# Patient Record
Sex: Male | Born: 1950 | Race: White | Hispanic: No | Marital: Married | Smoking: Never smoker
Health system: Southern US, Community
[De-identification: ages and names within clinical notes are randomized; demographics above are authoritative.]

## PROBLEM LIST (undated history)

## (undated) ENCOUNTER — Emergency Department (HOSPITAL_COMMUNITY): Admission: EM | Payer: Self-pay | Source: Home / Self Care

## (undated) DIAGNOSIS — J45909 Unspecified asthma, uncomplicated: Secondary | ICD-10-CM

## (undated) DIAGNOSIS — T8859XA Other complications of anesthesia, initial encounter: Secondary | ICD-10-CM

## (undated) DIAGNOSIS — L509 Urticaria, unspecified: Secondary | ICD-10-CM

## (undated) DIAGNOSIS — Z9889 Other specified postprocedural states: Secondary | ICD-10-CM

## (undated) DIAGNOSIS — T783XXA Angioneurotic edema, initial encounter: Secondary | ICD-10-CM

## (undated) DIAGNOSIS — C801 Malignant (primary) neoplasm, unspecified: Secondary | ICD-10-CM

## (undated) HISTORY — PX: ESOPHAGEAL DILATION: SHX303

## (undated) HISTORY — PX: CATARACT EXTRACTION, BILATERAL: SHX1313

## (undated) HISTORY — PX: COLONOSCOPY: SHX174

## (undated) HISTORY — DX: Urticaria, unspecified: L50.9

## (undated) HISTORY — DX: Angioneurotic edema, initial encounter: T78.3XXA

---
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
## 1998-07-06 ENCOUNTER — Encounter: Payer: Self-pay | Admitting: Emergency Medicine

## 1998-07-06 ENCOUNTER — Emergency Department (HOSPITAL_COMMUNITY): Admission: EM | Admit: 1998-07-06 | Discharge: 1998-07-06 | Payer: Self-pay

## 2003-12-20 ENCOUNTER — Ambulatory Visit (HOSPITAL_COMMUNITY): Admission: RE | Admit: 2003-12-20 | Discharge: 2003-12-20 | Payer: Self-pay | Admitting: Gastroenterology

## 2013-11-11 NOTE — ED Notes (Signed)
The pt no lolnger wants to be seen when called to triaged.  He took a benadryl and his symptoms have subsided.  He will return if he needs to.  He has had similar  Episodes in the past

## 2015-03-04 DIAGNOSIS — J45909 Unspecified asthma, uncomplicated: Secondary | ICD-10-CM | POA: Diagnosis not present

## 2015-09-03 DIAGNOSIS — H2513 Age-related nuclear cataract, bilateral: Secondary | ICD-10-CM | POA: Diagnosis not present

## 2015-09-03 DIAGNOSIS — D3132 Benign neoplasm of left choroid: Secondary | ICD-10-CM | POA: Diagnosis not present

## 2015-09-03 DIAGNOSIS — H02831 Dermatochalasis of right upper eyelid: Secondary | ICD-10-CM | POA: Diagnosis not present

## 2015-09-03 DIAGNOSIS — H02834 Dermatochalasis of left upper eyelid: Secondary | ICD-10-CM | POA: Diagnosis not present

## 2015-09-22 DIAGNOSIS — H2511 Age-related nuclear cataract, right eye: Secondary | ICD-10-CM | POA: Diagnosis not present

## 2015-09-22 DIAGNOSIS — H25811 Combined forms of age-related cataract, right eye: Secondary | ICD-10-CM | POA: Diagnosis not present

## 2015-11-03 DIAGNOSIS — H2512 Age-related nuclear cataract, left eye: Secondary | ICD-10-CM | POA: Diagnosis not present

## 2015-11-04 DIAGNOSIS — H2512 Age-related nuclear cataract, left eye: Secondary | ICD-10-CM | POA: Diagnosis not present

## 2015-11-07 DIAGNOSIS — D0462 Carcinoma in situ of skin of left upper limb, including shoulder: Secondary | ICD-10-CM | POA: Diagnosis not present

## 2015-11-07 DIAGNOSIS — L814 Other melanin hyperpigmentation: Secondary | ICD-10-CM | POA: Diagnosis not present

## 2015-11-07 DIAGNOSIS — C44629 Squamous cell carcinoma of skin of left upper limb, including shoulder: Secondary | ICD-10-CM | POA: Diagnosis not present

## 2015-11-07 DIAGNOSIS — Z85828 Personal history of other malignant neoplasm of skin: Secondary | ICD-10-CM | POA: Diagnosis not present

## 2015-11-07 DIAGNOSIS — L821 Other seborrheic keratosis: Secondary | ICD-10-CM | POA: Diagnosis not present

## 2015-11-07 DIAGNOSIS — D225 Melanocytic nevi of trunk: Secondary | ICD-10-CM | POA: Diagnosis not present

## 2015-11-07 DIAGNOSIS — L570 Actinic keratosis: Secondary | ICD-10-CM | POA: Diagnosis not present

## 2015-12-08 DIAGNOSIS — Z0000 Encounter for general adult medical examination without abnormal findings: Secondary | ICD-10-CM | POA: Diagnosis not present

## 2015-12-08 DIAGNOSIS — Z125 Encounter for screening for malignant neoplasm of prostate: Secondary | ICD-10-CM | POA: Diagnosis not present

## 2015-12-08 DIAGNOSIS — Z23 Encounter for immunization: Secondary | ICD-10-CM | POA: Diagnosis not present

## 2015-12-08 DIAGNOSIS — E7800 Pure hypercholesterolemia, unspecified: Secondary | ICD-10-CM | POA: Diagnosis not present

## 2016-05-06 DIAGNOSIS — Z85828 Personal history of other malignant neoplasm of skin: Secondary | ICD-10-CM | POA: Diagnosis not present

## 2016-05-06 DIAGNOSIS — L570 Actinic keratosis: Secondary | ICD-10-CM | POA: Diagnosis not present

## 2016-05-06 DIAGNOSIS — L814 Other melanin hyperpigmentation: Secondary | ICD-10-CM | POA: Diagnosis not present

## 2016-05-06 DIAGNOSIS — D1801 Hemangioma of skin and subcutaneous tissue: Secondary | ICD-10-CM | POA: Diagnosis not present

## 2016-05-06 DIAGNOSIS — L821 Other seborrheic keratosis: Secondary | ICD-10-CM | POA: Diagnosis not present

## 2016-05-06 DIAGNOSIS — D225 Melanocytic nevi of trunk: Secondary | ICD-10-CM | POA: Diagnosis not present

## 2016-11-08 DIAGNOSIS — L821 Other seborrheic keratosis: Secondary | ICD-10-CM | POA: Diagnosis not present

## 2016-11-08 DIAGNOSIS — L570 Actinic keratosis: Secondary | ICD-10-CM | POA: Diagnosis not present

## 2016-11-08 DIAGNOSIS — Z85828 Personal history of other malignant neoplasm of skin: Secondary | ICD-10-CM | POA: Diagnosis not present

## 2016-11-08 DIAGNOSIS — D1801 Hemangioma of skin and subcutaneous tissue: Secondary | ICD-10-CM | POA: Diagnosis not present

## 2016-11-08 DIAGNOSIS — L814 Other melanin hyperpigmentation: Secondary | ICD-10-CM | POA: Diagnosis not present

## 2016-12-10 DIAGNOSIS — Z0000 Encounter for general adult medical examination without abnormal findings: Secondary | ICD-10-CM | POA: Diagnosis not present

## 2016-12-10 DIAGNOSIS — Z23 Encounter for immunization: Secondary | ICD-10-CM | POA: Diagnosis not present

## 2016-12-10 DIAGNOSIS — Z125 Encounter for screening for malignant neoplasm of prostate: Secondary | ICD-10-CM | POA: Diagnosis not present

## 2016-12-10 DIAGNOSIS — E7800 Pure hypercholesterolemia, unspecified: Secondary | ICD-10-CM | POA: Diagnosis not present

## 2017-04-08 DIAGNOSIS — B078 Other viral warts: Secondary | ICD-10-CM | POA: Diagnosis not present

## 2017-04-08 DIAGNOSIS — L570 Actinic keratosis: Secondary | ICD-10-CM | POA: Diagnosis not present

## 2017-04-08 DIAGNOSIS — L821 Other seborrheic keratosis: Secondary | ICD-10-CM | POA: Diagnosis not present

## 2017-04-08 DIAGNOSIS — D225 Melanocytic nevi of trunk: Secondary | ICD-10-CM | POA: Diagnosis not present

## 2017-04-08 DIAGNOSIS — L820 Inflamed seborrheic keratosis: Secondary | ICD-10-CM | POA: Diagnosis not present

## 2017-04-08 DIAGNOSIS — Z85828 Personal history of other malignant neoplasm of skin: Secondary | ICD-10-CM | POA: Diagnosis not present

## 2017-04-08 DIAGNOSIS — L814 Other melanin hyperpigmentation: Secondary | ICD-10-CM | POA: Diagnosis not present

## 2017-08-30 DIAGNOSIS — H903 Sensorineural hearing loss, bilateral: Secondary | ICD-10-CM | POA: Diagnosis not present

## 2017-08-31 ENCOUNTER — Ambulatory Visit (INDEPENDENT_AMBULATORY_CARE_PROVIDER_SITE_OTHER): Payer: PPO | Admitting: Allergy & Immunology

## 2017-08-31 ENCOUNTER — Encounter: Payer: Self-pay | Admitting: Allergy & Immunology

## 2017-08-31 VITALS — BP 148/86 | HR 83 | Temp 98.0°F | Resp 19 | Ht 69.0 in | Wt 165.4 lb

## 2017-08-31 DIAGNOSIS — T7800XA Anaphylactic reaction due to unspecified food, initial encounter: Secondary | ICD-10-CM | POA: Insufficient documentation

## 2017-08-31 DIAGNOSIS — T63481A Toxic effect of venom of other arthropod, accidental (unintentional), initial encounter: Secondary | ICD-10-CM

## 2017-08-31 DIAGNOSIS — T7800XD Anaphylactic reaction due to unspecified food, subsequent encounter: Secondary | ICD-10-CM

## 2017-08-31 MED ORDER — EPINEPHRINE 0.3 MG/0.3ML IJ SOAJ: 0.3000 mg | Freq: Once | INTRAMUSCULAR | 2 refills | Status: AC

## 2017-08-31 NOTE — Progress Notes (Signed)
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 Anaphylactic shock - with a history consistent with alpha gal - We will get an alpha gal panel to evaluate for red meat sensitivity. - We will call you in 1-2 weeks with the results. - We will send in a prescription for EpiPen. - Information on alpha gal provided.  - I will talk to Dr. Stephan Minister about research studies.  - Natural course of alpha gal allergy discussed.   2. 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 - We will call you in 1-2 weeks with the results. - Consider venom immunotherapy in the future.  3. Return in about 2 years (around 09/01/2019).  Subjective:   Raymond Hess is a 67 y.o. male presenting today for evaluation of  Chief Complaint  Patient presents with  . Allergic Reaction    Alpha Gal   . Urticaria    Raymond Hess has a history of the following: Patient Active Problem List   Diagnosis Date Noted  . 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  Raymond Hess was referred by Alroy Dust, L.Marlou Sa, MD.     Raymond Hess is a 67 y.o. male presenting for an evaluation of alpha gal. He reports that he has had problems with red meat for years. He did eat a pepperoni pizza two months ago and broke out in hives. He does a lot of work with the outdoors [facility] to [employer]). He gets tick bites relatively frequently. Many years ago - around 7 years ago - he started having problems. He would awaken with hives over his entire body. Benadryl seemed to help. Finally he made the connection between the red meat ingestion and the symptoms. He does have one episode of stomach pain. Benadryl improves it  fairly quickly.   He is also concerned with a reaction to insect bites. Around 13 years ago, he was bit by fire ants and had a back several years ago. Then he started breaking out in mosquito bites like lesions. He did not have breathing problems, but he was turning red over his entire body. He has no problems with stinging insect anaphylaxis. He does spend a lot of time outdoors with his extracurricular activities.     Otherwise, there is no history of other atopic diseases, including asthma, drug allergies, food allergies, environmental allergies, or urticaria. There is no significant infectious history. Vaccinations are up to date.    Past Medical History: Patient Active Problem List   Diagnosis Date Noted  . 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 Always use your most recent med list.          EPINEPHrine 0.3 mg/0.3 mL Soaj injection Commonly known as:  EPIPEN 2-PAK Inject 0.3 mLs (0.3 mg total) into the muscle once for 1 dose.       Birth History: non-contributory.   Developmental History: non-contributory.  Past Surgical History: Past Surgical History:  Procedure Laterality Date  . CATARACT EXTRACTION, BILATERAL Bilateral      Family History: History reviewed. No pertinent family history.   Social History: Raymond Hess lives at home with his wife.  They live in a house that is 54 years old.  There is wood in the main living areas and carpeting in the bedrooms.  They have electric heating and central cooling.  There are 2 dogs in the home.  There are no dust mite covers on the bedding.  There is no tobacco exposure.  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      Review of Systems: a 14-point review of systems is pertinent for what is  mentioned in HPI.  Otherwise, all other systems were negative. Constitutional: negative other than that listed in the HPI Eyes: negative other than that listed in the HPI Ears, nose, mouth, throat, and face: negative other than that listed in the HPI Respiratory: negative other than that listed in the HPI Cardiovascular: negative other than that listed in the HPI Gastrointestinal: negative other than that listed in the HPI Genitourinary: negative other than that listed in the HPI Integument: negative other than that listed in the HPI Hematologic: negative other than that listed in the HPI Musculoskeletal: negative other than that listed in the HPI Neurological: negative other than that listed in the HPI Allergy/Immunologic: negative other than that listed in the HPI    Objective:   Blood pressure (!) 148/86, pulse 83, temperature 98 F (36.7 C), temperature source Oral, resp. rate 19, height 5\' 9"  (1.753 m), weight 165 lb 6.4 oz (75 kg), SpO2 99 %. Body mass index is 24.43 kg/m.   Physical Exam:  General: Alert, interactive, in no acute distress. Pleasant male.  Eyes: No conjunctival injection bilaterally, no discharge on the right, no discharge on the left and no Horner-Trantas dots present. PERRL bilaterally. EOMI without pain. No photophobia.  Ears: Right TM pearly gray with normal light reflex, Left TM pearly gray with normal light reflex, Right TM intact without perforation and Left TM intact without perforation.  Nose/Throat: External nose within normal limits and septum midline. Turbinates minimally edematous without discharge. Posterior oropharynx mildly erythematous without cobblestoning in the posterior oropharynx. Tonsils 2+ without exudates.  Tongue without thrush. Neck: Supple without thyromegaly. Trachea midline. Adenopathy: no enlarged lymph nodes appreciated in the anterior cervical, occipital, axillary, epitrochlear, inguinal, or popliteal regions. Lungs: Clear to  auscultation without wheezing, rhonchi or rales. No increased work of breathing. CV: Normal S1/S2. No murmurs. Capillary refill <2 seconds.  Abdomen: Nondistended, nontender. No guarding or rebound tenderness. Bowel sounds present in all fields and hypoactive  Skin: Warm and dry, without lesions or rashes. Extremities:  No clubbing, cyanosis or edema. Neuro:   Grossly intact. No focal deficits appreciated. Responsive to questions.  Diagnostic studies: none      Salvatore Marvel, MD [facility]

## 2017-08-31 NOTE — Patient Instructions (Addendum)
1. Anaphylactic shock due to food - We will get an alpha gal panel to evaluate for red meat sensitivity. - We will call you in 1-2 weeks with the results. - We will send in a prescription for EpiPen. - Information on alpha gal provided.  - I will talk to Dr. Stephan Minister about research studies.   2. 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 - We will call you in 1-2 weeks with the results. - Consider venom immunotherapy in the future.  3. Return in about 2 years (around 09/01/2019).   Please inform us of any Emergency Department visits, hospitalizations, or changes in symptoms. Call us before going to the ED for breathing or allergy symptoms since we might be able to fit you in for a sick visit. Feel free to contact us anytime with any questions, problems, or concerns.  It was a pleasure to meet you and your family today! Good luck with the [place] [facility] [place]!   Websites that have reliable patient information: 1. American Academy of Asthma, Allergy, and Immunology: www.aaaai.org 2. Food Allergy Research and Education (FARE): foodallergy.org 3. Mothers of Asthmatics: http://www.asthmacommunitynetwork.org 4. American College of Allergy, Asthma, and Immunology: MonthlyElectricBill.co.uk   Make sure you are registered to vote! If you have moved or changed any of your contact information, you will need to get this updated before voting!

## 2017-09-06 LAB — ALPHA-GAL PANEL
Class Interpretation: 4
Class Interpretation: 6
Class Interpretation: 6
Lamb/Mutton (Ovis spp) IgE: 33.7 kU/L — ABNORMAL HIGH (ref <0.35)

## 2017-09-06 LAB — ALLERGEN FIRE ANT: I070-IgE Fire Ant (Invicta): 0.14 kU/L — AB

## 2017-09-06 LAB — TRYPTASE: Tryptase: 5.5 ug/L (ref 2.2–13.2)

## 2017-09-07 DIAGNOSIS — H903 Sensorineural hearing loss, bilateral: Secondary | ICD-10-CM | POA: Diagnosis not present

## 2017-11-30 DIAGNOSIS — L430 Hypertrophic lichen planus: Secondary | ICD-10-CM | POA: Diagnosis not present

## 2017-11-30 DIAGNOSIS — L821 Other seborrheic keratosis: Secondary | ICD-10-CM | POA: Diagnosis not present

## 2017-11-30 DIAGNOSIS — L438 Other lichen planus: Secondary | ICD-10-CM | POA: Diagnosis not present

## 2017-11-30 DIAGNOSIS — L570 Actinic keratosis: Secondary | ICD-10-CM | POA: Diagnosis not present

## 2017-11-30 DIAGNOSIS — D0462 Carcinoma in situ of skin of left upper limb, including shoulder: Secondary | ICD-10-CM | POA: Diagnosis not present

## 2017-11-30 DIAGNOSIS — D485 Neoplasm of uncertain behavior of skin: Secondary | ICD-10-CM | POA: Diagnosis not present

## 2017-11-30 DIAGNOSIS — Z85828 Personal history of other malignant neoplasm of skin: Secondary | ICD-10-CM | POA: Diagnosis not present

## 2018-01-10 DIAGNOSIS — E7800 Pure hypercholesterolemia, unspecified: Secondary | ICD-10-CM | POA: Diagnosis not present

## 2018-01-10 DIAGNOSIS — Z0000 Encounter for general adult medical examination without abnormal findings: Secondary | ICD-10-CM | POA: Diagnosis not present

## 2018-01-10 DIAGNOSIS — Z125 Encounter for screening for malignant neoplasm of prostate: Secondary | ICD-10-CM | POA: Diagnosis not present

## 2018-12-04 DIAGNOSIS — L821 Other seborrheic keratosis: Secondary | ICD-10-CM | POA: Diagnosis not present

## 2018-12-04 DIAGNOSIS — D1801 Hemangioma of skin and subcutaneous tissue: Secondary | ICD-10-CM | POA: Diagnosis not present

## 2018-12-04 DIAGNOSIS — C44612 Basal cell carcinoma of skin of right upper limb, including shoulder: Secondary | ICD-10-CM | POA: Diagnosis not present

## 2018-12-04 DIAGNOSIS — L720 Epidermal cyst: Secondary | ICD-10-CM | POA: Diagnosis not present

## 2018-12-04 DIAGNOSIS — Z85828 Personal history of other malignant neoplasm of skin: Secondary | ICD-10-CM | POA: Diagnosis not present

## 2018-12-04 DIAGNOSIS — D225 Melanocytic nevi of trunk: Secondary | ICD-10-CM | POA: Diagnosis not present

## 2018-12-04 DIAGNOSIS — L570 Actinic keratosis: Secondary | ICD-10-CM | POA: Diagnosis not present

## 2018-12-04 DIAGNOSIS — L814 Other melanin hyperpigmentation: Secondary | ICD-10-CM | POA: Diagnosis not present

## 2019-02-26 DIAGNOSIS — Z0000 Encounter for general adult medical examination without abnormal findings: Secondary | ICD-10-CM | POA: Diagnosis not present

## 2019-02-26 DIAGNOSIS — Z125 Encounter for screening for malignant neoplasm of prostate: Secondary | ICD-10-CM | POA: Diagnosis not present

## 2019-02-26 DIAGNOSIS — E7800 Pure hypercholesterolemia, unspecified: Secondary | ICD-10-CM | POA: Diagnosis not present

## 2019-02-27 DIAGNOSIS — E7800 Pure hypercholesterolemia, unspecified: Secondary | ICD-10-CM | POA: Diagnosis not present

## 2019-02-27 DIAGNOSIS — Z125 Encounter for screening for malignant neoplasm of prostate: Secondary | ICD-10-CM | POA: Diagnosis not present

## 2019-05-30 DIAGNOSIS — D1801 Hemangioma of skin and subcutaneous tissue: Secondary | ICD-10-CM | POA: Diagnosis not present

## 2019-05-30 DIAGNOSIS — D485 Neoplasm of uncertain behavior of skin: Secondary | ICD-10-CM | POA: Diagnosis not present

## 2019-05-30 DIAGNOSIS — Z85828 Personal history of other malignant neoplasm of skin: Secondary | ICD-10-CM | POA: Diagnosis not present

## 2019-05-30 DIAGNOSIS — L814 Other melanin hyperpigmentation: Secondary | ICD-10-CM | POA: Diagnosis not present

## 2019-05-30 DIAGNOSIS — L821 Other seborrheic keratosis: Secondary | ICD-10-CM | POA: Diagnosis not present

## 2019-05-30 DIAGNOSIS — L820 Inflamed seborrheic keratosis: Secondary | ICD-10-CM | POA: Diagnosis not present

## 2019-05-30 DIAGNOSIS — L570 Actinic keratosis: Secondary | ICD-10-CM | POA: Diagnosis not present

## 2019-05-30 DIAGNOSIS — D225 Melanocytic nevi of trunk: Secondary | ICD-10-CM | POA: Diagnosis not present

## 2019-06-26 ENCOUNTER — Other Ambulatory Visit: Payer: Self-pay | Admitting: Physician Assistant

## 2019-06-26 DIAGNOSIS — R1012 Left upper quadrant pain: Secondary | ICD-10-CM

## 2019-06-26 DIAGNOSIS — R1011 Right upper quadrant pain: Secondary | ICD-10-CM

## 2019-06-26 DIAGNOSIS — F1099 Alcohol use, unspecified with unspecified alcohol-induced disorder: Secondary | ICD-10-CM | POA: Diagnosis not present

## 2019-06-26 DIAGNOSIS — R1310 Dysphagia, unspecified: Secondary | ICD-10-CM | POA: Diagnosis not present

## 2019-06-27 ENCOUNTER — Ambulatory Visit
Admission: RE | Admit: 2019-06-27 | Discharge: 2019-06-27 | Disposition: A | Discharge status: Home / Self Care | Payer: PPO | Source: Ambulatory Visit | Attending: Physician Assistant | Admitting: Physician Assistant

## 2019-06-27 DIAGNOSIS — R1011 Right upper quadrant pain: Secondary | ICD-10-CM

## 2019-06-27 DIAGNOSIS — R1013 Epigastric pain: Secondary | ICD-10-CM | POA: Diagnosis not present

## 2019-06-27 DIAGNOSIS — R1012 Left upper quadrant pain: Secondary | ICD-10-CM

## 2019-07-02 ENCOUNTER — Encounter: Payer: Self-pay | Admitting: Physician Assistant

## 2019-07-11 DIAGNOSIS — R109 Unspecified abdominal pain: Secondary | ICD-10-CM | POA: Diagnosis not present

## 2019-07-11 DIAGNOSIS — Z86010 Personal history of colonic polyps: Secondary | ICD-10-CM | POA: Diagnosis not present

## 2019-07-11 DIAGNOSIS — R1310 Dysphagia, unspecified: Secondary | ICD-10-CM | POA: Diagnosis not present

## 2019-07-24 ENCOUNTER — Ambulatory Visit: Payer: PPO | Admitting: Physician Assistant

## 2019-08-16 DIAGNOSIS — Z1159 Encounter for screening for other viral diseases: Secondary | ICD-10-CM | POA: Diagnosis not present

## 2019-08-21 ENCOUNTER — Other Ambulatory Visit (HOSPITAL_COMMUNITY): Payer: Self-pay | Admitting: Gastroenterology

## 2019-08-21 ENCOUNTER — Other Ambulatory Visit: Payer: Self-pay | Admitting: Gastroenterology

## 2019-08-21 DIAGNOSIS — K449 Diaphragmatic hernia without obstruction or gangrene: Secondary | ICD-10-CM | POA: Diagnosis not present

## 2019-08-21 DIAGNOSIS — K2289 Other specified disease of esophagus: Secondary | ICD-10-CM

## 2019-08-21 DIAGNOSIS — R1310 Dysphagia, unspecified: Secondary | ICD-10-CM | POA: Diagnosis not present

## 2019-08-21 DIAGNOSIS — C159 Malignant neoplasm of esophagus, unspecified: Secondary | ICD-10-CM | POA: Diagnosis not present

## 2019-08-21 DIAGNOSIS — K648 Other hemorrhoids: Secondary | ICD-10-CM | POA: Diagnosis not present

## 2019-08-21 DIAGNOSIS — D123 Benign neoplasm of transverse colon: Secondary | ICD-10-CM | POA: Diagnosis not present

## 2019-08-21 DIAGNOSIS — D490 Neoplasm of unspecified behavior of digestive system: Secondary | ICD-10-CM | POA: Diagnosis not present

## 2019-08-21 DIAGNOSIS — Z86010 Personal history of colonic polyps: Secondary | ICD-10-CM | POA: Diagnosis not present

## 2019-08-21 DIAGNOSIS — K222 Esophageal obstruction: Secondary | ICD-10-CM | POA: Diagnosis not present

## 2019-08-23 DIAGNOSIS — K229 Disease of esophagus, unspecified: Secondary | ICD-10-CM | POA: Diagnosis not present

## 2019-08-24 DIAGNOSIS — D123 Benign neoplasm of transverse colon: Secondary | ICD-10-CM | POA: Diagnosis not present

## 2019-08-24 DIAGNOSIS — D490 Neoplasm of unspecified behavior of digestive system: Secondary | ICD-10-CM | POA: Diagnosis not present

## 2019-08-27 ENCOUNTER — Ambulatory Visit (HOSPITAL_COMMUNITY)
Admission: RE | Admit: 2019-08-27 | Discharge: 2019-08-27 | Disposition: A | Discharge status: Home / Self Care | Payer: PPO | Source: Ambulatory Visit | Attending: Gastroenterology | Admitting: Gastroenterology

## 2019-08-27 ENCOUNTER — Other Ambulatory Visit: Payer: Self-pay

## 2019-08-27 DIAGNOSIS — R1013 Epigastric pain: Secondary | ICD-10-CM | POA: Insufficient documentation

## 2019-08-27 DIAGNOSIS — K2289 Other specified disease of esophagus: Secondary | ICD-10-CM

## 2019-08-27 DIAGNOSIS — R918 Other nonspecific abnormal finding of lung field: Secondary | ICD-10-CM | POA: Diagnosis not present

## 2019-08-27 DIAGNOSIS — K228 Other specified diseases of esophagus: Secondary | ICD-10-CM | POA: Insufficient documentation

## 2019-08-27 DIAGNOSIS — C159 Malignant neoplasm of esophagus, unspecified: Secondary | ICD-10-CM | POA: Insufficient documentation

## 2019-08-27 MED ORDER — IOHEXOL 300 MG/ML  SOLN
100.0000 mL | Freq: Once | INTRAMUSCULAR | Status: AC | PRN
  Administered 2019-08-27: 100 mL via INTRAVENOUS

## 2019-08-27 MED ORDER — SODIUM CHLORIDE (PF) 0.9 % IJ SOLN
INTRAMUSCULAR | Status: AC
  Filled 2019-08-27: qty 50

## 2019-08-27 NOTE — Progress Notes (Signed)
Spoke with patient and his wife Raymond Hess regarding appointment with Dr. Benay Spice on Tuesday 7/20 at 2 pm to arrive by 1:45 for check in.  I explained that he can bring one person with him to the appointment.  She states their daughter is a PA and just got in town to support them with this diagnosis.  They would like her to be on speaker phone during the visit.  They were given our location.

## 2019-08-28 ENCOUNTER — Telehealth: Payer: Self-pay | Admitting: Oncology

## 2019-08-28 ENCOUNTER — Other Ambulatory Visit: Payer: Self-pay

## 2019-08-28 ENCOUNTER — Inpatient Hospital Stay: Payer: PPO | Attending: Oncology | Admitting: Oncology

## 2019-08-28 VITALS — BP 134/76 | HR 81 | Temp 97.7°F | Resp 17 | Ht 69.0 in | Wt 163.5 lb

## 2019-08-28 DIAGNOSIS — C154 Malignant neoplasm of middle third of esophagus: Secondary | ICD-10-CM | POA: Diagnosis not present

## 2019-08-28 DIAGNOSIS — R1319 Other dysphagia: Secondary | ICD-10-CM | POA: Diagnosis not present

## 2019-08-28 DIAGNOSIS — Z85828 Personal history of other malignant neoplasm of skin: Secondary | ICD-10-CM | POA: Diagnosis not present

## 2019-08-28 DIAGNOSIS — I2510 Atherosclerotic heart disease of native coronary artery without angina pectoris: Secondary | ICD-10-CM | POA: Insufficient documentation

## 2019-08-28 NOTE — Progress Notes (Signed)
[facility] New Patient Consult   Requesting MD: Donavan Burnet, Md [address] [employer] [place],  [place] [address]   Raymond Hess 69 y.o.  12-Aug-1950    Reason for Consult: Esophagus cancer   HPI: Mr. Nelida Gores reports developing right submandibular pain in November 2020.  He was concerned that he could have a dental abscess.  He reports a negative dental evaluation.  He was evaluated by his primary provider and referred for an abdominal ultrasound on 06/27/2019.  This was a normal study. I he reports right anterior chest discomfort and saw the dysphagia for the past several months.  He was referred to Dr. Alessandra Bevels and was taken to an upper endoscopy on 08/21/2019.  A large ulcerated mass with bleeding was found in the mid esophagus at 25-32 cm from the incisors.  The mass was nonobstructing and partially circumferential.  Biopsies were obtained.  A widely patent Schatzki ring was found at the GE junction at 37 cm. 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  A tubular adenoma was removed from the colon on the same day. CTs of the chest, abdomen, and pelvis on 08/27/2019 revealed circumferential thickening of the midesophagus consistent with a primary esophagus tumor.  An 8 mm right paraesophageal lymph node is suspicious for a metastasis.  No other evidence of metastatic disease.  Innumerable fine centrilobular pulmonary nodules are felt to be benign.   Past Medical History:  Diagnosis Date  . Angio-edema   . Urticaria     .  Bilateral hearing loss   .  Squamous cell carcinoma of the skin  Past Surgical History:  Procedure Laterality Date  . CATARACT EXTRACTION, BILATERAL Bilateral     Medications: Reviewed  Allergies:  Allergies  Allergen Reactions  . Other     Alphagal (mammal meat)  . Zinc Gelatin [Zinc]     Family history: His father died of lung  cancer at age 70 and was a smoker.  His mother died at age 72.  She had a history of breast cancer and  died from a "neuroendocrine "tumor  Social History:   He lives with his wife in [place].  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  He does not use cigarettes.  He drinks 2 beers and 2 glasses of wine daily.  No transfusion history.  No risk factor for HIV or hepatitis.  ROS:   Positives include: Solid dysphagia, odynophagia, anterior chest discomfort improved after starting Nexium, right submandibular pain-resolved after starting Nexium  A complete ROS was otherwise negative.  Physical Exam:  Blood pressure 134/76, pulse 81, temperature 97.7 F (36.5 C), temperature source Temporal, resp. rate 17, height 5' 9"  (1.753 m), weight 163 lb 8 oz (74.2 kg), SpO2 100 %.  HEENT: Oropharynx without visible mass, neck without mass Lungs: Clear bilaterally Cardiac: Regular rate and rhythm Abdomen: No hepatosplenomegaly, no mass, nontender  Vascular: No leg edema Lymph nodes: No cervical, supraclavicular, right axillary, or inguinal nodes, 1 cm soft mobile high left axillary node? Neurologic: Alert and oriented, the motor exam appears intact in the upper and lower extremities bilaterally Skin: No rash Musculoskeletal: No spine tenderness     Imaging:  CT CHEST W CONTRAST  Result Date: 08/27/2019 CLINICAL DATA:  Esophageal mass, initial staging EXAM: CT CHEST AND ABDOMEN WITH CONTRAST TECHNIQUE: Multidetector CT imaging of the chest and abdomen was performed following the standard protocol during bolus  administration of intravenous contrast. CONTRAST:  154m OMNIPAQUE IOHEXOL 300 MG/ML SOLN, additional oral enteric contrast COMPARISON:  None. FINDINGS: CT CHEST FINDINGS Cardiovascular: Left coronary artery calcifications. Normal heart size. No pericardial effusion. Mediastinum/Nodes: There is a subcentimeter superior right paraesophageal lymph node measuring 8 mm (series 2, image 10).  No other enlarged lymph nodes in the chest. Elongated circumferential thickening of the midesophagus, arising at the level of the aortic arch and extending to the left atrium, measuring approximately 8.5 cm in length (series 2, image 24, series 6, image 83). Thyroid gland and trachea demonstrate no significant findings. Lungs/Pleura: Innumerable very fine centrilobular pulmonary nodules, most numerous in the lung apices. No pleural effusion or pneumothorax. Musculoskeletal: No chest wall mass or suspicious bone lesions identified. CT ABDOMEN PELVIS FINDINGS Hepatobiliary: No solid liver abnormality is seen. No gallstones, gallbladder wall thickening, or biliary dilatation. Pancreas: Unremarkable. No pancreatic ductal dilatation or surrounding inflammatory changes. Spleen: Normal in size without significant abnormality. Adrenals/Urinary Tract: Adrenal glands are unremarkable. Kidneys are normal, without renal calculi, solid lesion, or hydronephrosis. Bladder is unremarkable. Stomach/Bowel: Stomach is within normal limits. Appendix appears normal. No evidence of bowel wall thickening, distention, or inflammatory changes. Vascular/Lymphatic: Scattered aortic atherosclerosis no enlarged abdominal lymph nodes. Other: No abdominal wall hernia or abnormality. No abdominopelvic ascites. Musculoskeletal: No acute or significant osseous findings. IMPRESSION: 1. Elongated circumferential thickening of the midesophagus, arising at the level of the aortic arch and extending to the left atrium, measuring approximately 8.5 cm in length, consistent with primary esophageal malignancy. 2. There is a subcentimeter superior right paraesophageal lymph node measuring 8 mm, suspicious for nodal metastatic disease. No other enlarged lymph nodes in the chest. 3. No other evidence of metastatic disease in the chest or abdomen. 4. Innumerable very fine centrilobular pulmonary nodules, most numerous in the lung apices, most commonly seen in  smoking-related respiratory bronchiolitis. 5. Coronary artery disease.  Aortic Atherosclerosis (ICD10-I70.0). Electronically Signed   By: AEddie CandleM.D.   On: 08/27/2019 12:39   CT ABDOMEN W CONTRAST  Result Date: 08/27/2019 CLINICAL DATA:  Esophageal mass, initial staging EXAM: CT CHEST AND ABDOMEN WITH CONTRAST TECHNIQUE: Multidetector CT imaging of the chest and abdomen was performed following the standard protocol during bolus administration of intravenous contrast. CONTRAST:  1062mOMNIPAQUE IOHEXOL 300 MG/ML SOLN, additional oral enteric contrast COMPARISON:  None. FINDINGS: CT CHEST FINDINGS Cardiovascular: Left coronary artery calcifications. Normal heart size. No pericardial effusion. Mediastinum/Nodes: There is a subcentimeter superior right paraesophageal lymph node measuring 8 mm (series 2, image 10). No other enlarged lymph nodes in the chest. Elongated circumferential thickening of the midesophagus, arising at the level of the aortic arch and extending to the left atrium, measuring approximately 8.5 cm in length (series 2, image 24, series 6, image 83). Thyroid gland and trachea demonstrate no significant findings. Lungs/Pleura: Innumerable very fine centrilobular pulmonary nodules, most numerous in the lung apices. No pleural effusion or pneumothorax. Musculoskeletal: No chest wall mass or suspicious bone lesions identified. CT ABDOMEN PELVIS FINDINGS Hepatobiliary: No solid liver abnormality is seen. No gallstones, gallbladder wall thickening, or biliary dilatation. Pancreas: Unremarkable. No pancreatic ductal dilatation or surrounding inflammatory changes. Spleen: Normal in size without significant abnormality. Adrenals/Urinary Tract: Adrenal glands are unremarkable. Kidneys are normal, without renal calculi, solid lesion, or hydronephrosis. Bladder is unremarkable. Stomach/Bowel: Stomach is within normal limits. Appendix appears normal. No evidence of bowel wall thickening, distention, or  inflammatory changes. Vascular/Lymphatic: Scattered aortic atherosclerosis no enlarged abdominal lymph nodes. Other: No  abdominal wall hernia or abnormality. No abdominopelvic ascites. Musculoskeletal: No acute or significant osseous findings. IMPRESSION: 1. Elongated circumferential thickening of the midesophagus, arising at the level of the aortic arch and extending to the left atrium, measuring approximately 8.5 cm in length, consistent with primary esophageal malignancy. 2. There is a subcentimeter superior right paraesophageal lymph node measuring 8 mm, suspicious for nodal metastatic disease. No other enlarged lymph nodes in the chest. 3. No other evidence of metastatic disease in the chest or abdomen. 4. Innumerable very fine centrilobular pulmonary nodules, most numerous in the lung apices, most commonly seen in smoking-related respiratory bronchiolitis. 5. Coronary artery disease.  Aortic Atherosclerosis (ICD10-I70.0). Electronically Signed   By: Eddie Candle M.D.   On: 08/27/2019 12:39      Assessment/Plan:   1. Squamous cell carcinoma of the midesophagus  Upper endoscopy 08/21/2019-nonobstructing mass in the mid esophagus, biopsy confirmed invasive squamous cell carcinoma  CTs 08/27/2019-elongated circumferential thickening of the midesophagus consistent with a primary esophageal malignancy, 8 cm right paraesophageal node suspicious for metastasis, very fine centrilobular pulmonary nodules felt most likely benign 2.   Dysphagia/odynophagia secondary to #1 3.   Alphagal meat allergy 4.   History of squamous cell skin cancer   Disposition:   Mr. Ramdass has been diagnosed with squamous cell carcinoma midesophagus.  He appears to have locally advanced disease based on the staging evaluation today.  He will be referred for a staging PET scan.  I reviewed the CT images and discussed treatment options with Mr. Lama and his wife.  He appears to be a candidate for concurrent  chemotherapy/radiation and consideration of an esophagectomy.  I will present his case at the GI tumor conference.  We will make referral to Dr. Lisbeth Renshaw and Dr. Servando Snare.  I recommend concurrent Taxol/carboplatin and radiation.  We reviewed potential toxicities associated with the Taxol/carboplatin regimen including the chance for nausea/vomiting, alopecia, and hematologic toxicity.  We discussed the allergic reaction, neuropathy, and bone pain associated with Taxol.  We discussed the nausea and allergic reaction seen with carboplatin.  He agrees to proceed.  Mr. Mccadden will return for an office visit and further discussion after the staging PET scan.  I anticipate beginning concurrent chemotherapy and radiation during the week of 09/10/2019.  We will request a PD-L1 score on the esophagus biopsy.  An HPV stain is pending.  Betsy Coder, MD  08/28/2019, 3:59 PM

## 2019-08-28 NOTE — H&P (View-Only) (Signed)
START ON PATHWAY REGIMEN - Gastroesophageal ? ? ?  Administer weekly during RT: ?    Paclitaxel  ?    Carboplatin  ? ?**Always confirm dose/schedule in your pharmacy ordering system** ? ?Patient Characteristics: ?Esophageal & GE Junction, Squamous Cell, Preoperative or Nonsurgical Candidate (Clinical Staging), cT2 or Higher or cN+, Surgical Candidate (Up to cT4a) - Preoperative Therapy ?Histology: Squamous Cell ?Disease Classification: Esophageal ?Therapeutic Status: Preoperative or Nonsurgical Candidate (Clinical Staging) ?AJCC M Category: Staged < 8th Ed. ?AJCC 8 Stage Grouping: Staged < 8th Ed. ?AJCC Grade: Staged < 8th Ed. ?AJCC Location: Staged < 8th Ed. ?AJCC T Category: Staged < 8th Ed. ?AJCC N Category: Staged < 8th Ed. ?Intent of Therapy: ?Curative Intent, Discussed with Patient ?

## 2019-08-28 NOTE — Telephone Encounter (Signed)
Scheduled per 7/20 los. Printed avs and calendar for pt. Pt only request to see GBS.

## 2019-08-28 NOTE — Progress Notes (Signed)
START ON PATHWAY REGIMEN - Gastroesophageal ? ? ?  Administer weekly during RT: ?    Paclitaxel  ?    Carboplatin  ? ?**Always confirm dose/schedule in your pharmacy ordering system** ? ?Patient Characteristics: ?Esophageal & GE Junction, Squamous Cell, Preoperative or Nonsurgical Candidate (Clinical Staging), cT2 or Higher or cN+, Surgical Candidate (Up to cT4a) - Preoperative Therapy ?Histology: Squamous Cell ?Disease Classification: Esophageal ?Therapeutic Status: Preoperative or Nonsurgical Candidate (Clinical Staging) ?AJCC M Category: Staged < 8th Ed. ?AJCC 8 Stage Grouping: Staged < 8th Ed. ?AJCC Grade: Staged < 8th Ed. ?AJCC Location: Staged < 8th Ed. ?AJCC T Category: Staged < 8th Ed. ?AJCC N Category: Staged < 8th Ed. ?Intent of Therapy: ?Curative Intent, Discussed with Patient ?

## 2019-08-29 ENCOUNTER — Encounter: Payer: Self-pay | Admitting: *Deleted

## 2019-08-29 ENCOUNTER — Other Ambulatory Visit: Payer: Self-pay | Admitting: *Deleted

## 2019-08-29 ENCOUNTER — Other Ambulatory Visit: Payer: Self-pay

## 2019-08-29 DIAGNOSIS — C154 Malignant neoplasm of middle third of esophagus: Secondary | ICD-10-CM

## 2019-08-29 MED ORDER — PROCHLORPERAZINE MALEATE 10 MG PO TABS
10.0000 mg | ORAL_TABLET | Freq: Four times a day (QID) | ORAL | 1 refills | Status: DC | PRN
Start: 2019-08-29 — End: 2019-09-11

## 2019-08-29 MED ORDER — DEXAMETHASONE 2 MG PO TABS
10.0000 mg | ORAL_TABLET | ORAL | 0 refills | Status: DC
Start: 2019-08-29 — End: 2019-09-11

## 2019-08-29 NOTE — Progress Notes (Signed)
Per Dr. Benay Spice request: Email to [facility] requesting assistance to contact [facility] to have PDL1 score done on case #[id number] dated 08/21/19 (esophagus bx).

## 2019-08-29 NOTE — Progress Notes (Signed)
Spoke with patient's wife Butch Penny regarding Chemo Education class.  I have scheduled it for 7/29 at 4:00 pm.  She also would like a referral to the nutritionist which I have ordered.

## 2019-08-29 NOTE — Progress Notes (Signed)
Message sent to [facility]igator requesting assistance and getting him in for chemotherapy education session.

## 2019-08-29 NOTE — Progress Notes (Signed)
Faxed request to [facility] to add on PD-L1 testing to [facility]: [phone]. I requested they fax results to Dr. Julieanne Manson at [phone].  I left my direct contact number should they have questions.

## 2019-08-29 NOTE — Progress Notes (Signed)
GI Location of Tumor / Histology: Esophagus- Middle third  Raymond Hess presented with right anterior chest discomfort with dysphagia for the past several months.  PET 08/31/2019:   CT CAP 08/27/2019: circumferential thickening of the midesophagus consistent with a primary esophagus tumor.  An 8 mm right paraesophageal lymph node is suspicious for a metastasis.  No other evidence of metastatic disease.  Innumerable fine centrilobular pulmonary nodules are felt to be benign.  Upper Endoscopy 08/21/2019: Large ulcerated mass with bleeding was found in the mid esophagus at 25-32 cm from the incisors.  The mass was non-obstructing and partially circumferential.  Biopsies of Esophagus 08/21/2019   Past/Anticipated interventions by surgeon, if any:  No appointment scheduled with Dr. Madolyn Frieze, referral has been sent.   Past/Anticipated interventions by medical oncology, if any:  Dr. Benay Spice 08/28/2019 -I reviewed the CT images and discussed treatment options with Raymond Hess and his wife.  He appears to be a candidate for concurrent chemotherapy/radiation and consideration of an esophagectomy.  -I will present his case at the GI tumor conference.  We will make referral to Dr. Lisbeth Renshaw and Dr. Servando Snare. - Chemo start possible first of August -Follow-up 7/29.   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  He has been staying away from breads, they are harder to swallow.  SAFETY ISSUES:  Prior radiation? No  Pacemaker/ICD? no  Possible current pregnancy? N/a   Is the patient on methotrexate? No  Current Complaints/Details:

## 2019-08-30 ENCOUNTER — Ambulatory Visit
Admission: RE | Admit: 2019-08-30 | Discharge: 2019-08-30 | Disposition: A | Discharge status: Home / Self Care | Payer: PPO | Source: Ambulatory Visit | Attending: Radiation Oncology | Admitting: Radiation Oncology

## 2019-08-30 ENCOUNTER — Other Ambulatory Visit: Payer: Self-pay

## 2019-08-30 ENCOUNTER — Telehealth: Payer: Self-pay | Admitting: Oncology

## 2019-08-30 ENCOUNTER — Encounter: Payer: Self-pay | Admitting: Radiation Oncology

## 2019-08-30 ENCOUNTER — Telehealth: Payer: Self-pay

## 2019-08-30 DIAGNOSIS — C154 Malignant neoplasm of middle third of esophagus: Secondary | ICD-10-CM | POA: Diagnosis not present

## 2019-08-30 NOTE — Telephone Encounter (Signed)
Scheduled appointment per 7/21 provider message. Patient is aware of appointment date and time.

## 2019-08-30 NOTE — Telephone Encounter (Signed)
I spoke with patient's wife Butch Penny to let her know that his PET scan has been moved to tomorrow 7/23 to arrive at 6:30 am at [facility] Radiology, NPO 6 hours prior.  She verbalized an understanding.

## 2019-08-30 NOTE — Progress Notes (Signed)
Radiation Oncology         [phone] ________________________________  Initial Outpatient Consultation - Conducted via telephone due to current COVID-19 concerns for limiting patient exposure  I spoke with the patient to conduct this consult visit via telephone to spare the patient unnecessary potential exposure in the healthcare setting during the current COVID-19 pandemic. The patient was notified in advance and was offered a [place] meeting to allow for face to face communication but unfortunately reported that they did not have the appropriate resources/technology to support such a visit and instead preferred to proceed with a telephone consult.    Name: Raymond Hess        MRN: [id number]  Date of Service: 08/30/2019 DOB: [birth year]  [id number], L.Marlou Sa, MD  Ladell Pier, MD     REFERRING PHYSICIAN: Ladell Pier, MD   DIAGNOSIS: The encounter diagnosis was Cancer of middle third of esophagus ([place]).   HISTORY OF PRESENT ILLNESS: Raymond Hess is a 69 y.o. male seen at the request of Dr. 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 He was having symptoms over the past few months of what was felt to be reflux and some mid chest discomfort. He was started on Nexium that did seem to help but was counseled on the rationale for EGD which was performed on 08/21/19 and this revealed a large ulcerated mass within the mid esophagus. This was nonobstructing and partially circumferential. He did have a Schatzki ring at the GE junction. A biopsy of the esophageal mass revealed an invasive moderately differentiated squamous cell carcinoma of the esophagus. A tubular adenoma was also seen in the colon. He was sent for CT staging on 08/27/19 that revealed an 8.5 cm thickening seen in the mid esophagus at the level of the aortic arch extending to the left atrium consistent with esophageal cancer. There was an 8 mm right paraesophageal lymph node seen as  well that was suspicious for disease. No other evidence of disease was seen, but he did have stigmata of fine centrilobular pulmonary nodules throughout the lungs, as well as atherosclerotic disease of the aorta and coronary vessels. He's going for PET imaging tomorrow morning, and is contacted today to discuss treatment options with chemoRT.    PREVIOUS RADIATION THERAPY: No   PAST MEDICAL HISTORY:  Past Medical History:  Diagnosis Date  . Angio-edema   . Urticaria        PAST SURGICAL HISTORY: Past Surgical History:  Procedure Laterality Date  . CATARACT EXTRACTION, BILATERAL Bilateral      FAMILY HISTORY: History reviewed. No pertinent family history.   SOCIAL HISTORY:  reports that he has never smoked. He has never used smokeless tobacco. He reports current alcohol use of about 2.0 standard drinks of alcohol per week. He reports that he does not use drugs. The patient is married and lives in [place]. He enjoys hiking and working outside.    ALLERGIES: Other and Zinc gelatin [zinc]   MEDICATIONS:  Current Outpatient Medications  Medication Sig Dispense Refill  . dexamethasone (DECADRON) 2 MG tablet Take 5 tablets (10 mg total) by mouth as directed. Take at 10 pm night before 1st Taxol and repeat at 6 am the day of 1st Taxol (Patient not taking: Reported on 08/30/2019) 10 tablet 0  . esomeprazole (NEXIUM) 40 MG capsule Take 40 mg by mouth daily. (Patient not taking: Reported on 08/30/2019)    . prochlorperazine (COMPAZINE) 10 MG  tablet Take 1 tablet (10 mg total) by mouth every 6 (six) hours as needed for nausea. (Patient not taking: Reported on 08/30/2019) 60 tablet 1   No current facility-administered medications for this encounter.     REVIEW OF SYSTEMS: On review of systems, the patient reports that he is doing well overall. He is tolerating most foods with the exception of bread which is difficult to swallow. He also has an allergy to meat, but the foods he is able  to eat otherwise are tolerated. He does have some dull pain deeper in the lower chest and upper abdomen at times with eating. He has not had unintentional weight changes. No other complaints are noted.     PHYSICAL EXAM:  Wt Readings from Last 3 Encounters:  08/28/19 163 lb 8 oz (74.2 kg)  08/31/17 165 lb 6.4 oz (75 kg)   Unable to assess due to encounter type.  ECOG = 1  0 - Asymptomatic (Fully active, able to carry on all predisease activities without restriction)  1 - Symptomatic but completely ambulatory (Restricted in physically strenuous activity but ambulatory and able to carry out work of a light or sedentary nature. For example, light housework, office work)  2 - Symptomatic, <50% in bed during the day (Ambulatory and capable of all self care but unable to carry out any work activities. Up and about more than 50% of waking hours)  3 - Symptomatic, >50% in bed, but not bedbound (Capable of only limited self-care, confined to bed or chair 50% or more of waking hours)  4 - Bedbound (Completely disabled. Cannot carry on any self-care. Totally confined to bed or chair)  5 - Death   Eustace Pen MM, Creech RH, Tormey DC, et al. [phone]). "Toxicity and response criteria of the [facility]". [place] Oncol. 5 (6): 649-55    LABORATORY DATA:  No results found for: WBC, HGB, HCT, MCV, PLT No results found for: NA, K, CL, CO2 No results found for: ALT, AST, GGT, ALKPHOS, BILITOT    RADIOGRAPHY: CT CHEST W CONTRAST  Result Date: 08/27/2019 CLINICAL DATA:  Esophageal mass, initial staging EXAM: CT CHEST AND ABDOMEN WITH CONTRAST TECHNIQUE: Multidetector CT imaging of the chest and abdomen was performed following the standard protocol during bolus administration of intravenous contrast. CONTRAST:  182mL OMNIPAQUE IOHEXOL 300 MG/ML SOLN, additional oral enteric contrast COMPARISON:  None. FINDINGS: CT CHEST FINDINGS Cardiovascular: Left coronary artery calcifications.  Normal heart size. No pericardial effusion. Mediastinum/Nodes: There is a subcentimeter superior right paraesophageal lymph node measuring 8 mm (series 2, image 10). No other enlarged lymph nodes in the chest. Elongated circumferential thickening of the midesophagus, arising at the level of the aortic arch and extending to the left atrium, measuring approximately 8.5 cm in length (series 2, image 24, series 6, image 83). Thyroid gland and trachea demonstrate no significant findings. Lungs/Pleura: Innumerable very fine centrilobular pulmonary nodules, most numerous in the lung apices. No pleural effusion or pneumothorax. Musculoskeletal: No chest wall mass or suspicious bone lesions identified. CT ABDOMEN PELVIS FINDINGS Hepatobiliary: No solid liver abnormality is seen. No gallstones, gallbladder wall thickening, or biliary dilatation. Pancreas: Unremarkable. No pancreatic ductal dilatation or surrounding inflammatory changes. Spleen: Normal in size without significant abnormality. Adrenals/Urinary Tract: Adrenal glands are unremarkable. Kidneys are normal, without renal calculi, solid lesion, or hydronephrosis. Bladder is unremarkable. Stomach/Bowel: Stomach is within normal limits. Appendix appears normal. No evidence of bowel wall thickening, distention, or inflammatory changes. Vascular/Lymphatic: Scattered aortic atherosclerosis  no enlarged abdominal lymph nodes. Other: No abdominal wall hernia or abnormality. No abdominopelvic ascites. Musculoskeletal: No acute or significant osseous findings. IMPRESSION: 1. Elongated circumferential thickening of the midesophagus, arising at the level of the aortic arch and extending to the left atrium, measuring approximately 8.5 cm in length, consistent with primary esophageal malignancy. 2. There is a subcentimeter superior right paraesophageal lymph node measuring 8 mm, suspicious for nodal metastatic disease. No other enlarged lymph nodes in the chest. 3. No other  evidence of metastatic disease in the chest or abdomen. 4. Innumerable very fine centrilobular pulmonary nodules, most numerous in the lung apices, most commonly seen in smoking-related respiratory bronchiolitis. 5. Coronary artery disease.  Aortic Atherosclerosis (ICD10-I70.0). Electronically Signed   By: Eddie Candle M.D.   On: 08/27/2019 12:39   CT ABDOMEN W CONTRAST  Result Date: 08/27/2019 CLINICAL DATA:  Esophageal mass, initial staging EXAM: CT CHEST AND ABDOMEN WITH CONTRAST TECHNIQUE: Multidetector CT imaging of the chest and abdomen was performed following the standard protocol during bolus administration of intravenous contrast. CONTRAST:  149mL OMNIPAQUE IOHEXOL 300 MG/ML SOLN, additional oral enteric contrast COMPARISON:  None. FINDINGS: CT CHEST FINDINGS Cardiovascular: Left coronary artery calcifications. Normal heart size. No pericardial effusion. Mediastinum/Nodes: There is a subcentimeter superior right paraesophageal lymph node measuring 8 mm (series 2, image 10). No other enlarged lymph nodes in the chest. Elongated circumferential thickening of the midesophagus, arising at the level of the aortic arch and extending to the left atrium, measuring approximately 8.5 cm in length (series 2, image 24, series 6, image 83). Thyroid gland and trachea demonstrate no significant findings. Lungs/Pleura: Innumerable very fine centrilobular pulmonary nodules, most numerous in the lung apices. No pleural effusion or pneumothorax. Musculoskeletal: No chest wall mass or suspicious bone lesions identified. CT ABDOMEN PELVIS FINDINGS Hepatobiliary: No solid liver abnormality is seen. No gallstones, gallbladder wall thickening, or biliary dilatation. Pancreas: Unremarkable. No pancreatic ductal dilatation or surrounding inflammatory changes. Spleen: Normal in size without significant abnormality. Adrenals/Urinary Tract: Adrenal glands are unremarkable. Kidneys are normal, without renal calculi, solid lesion, or  hydronephrosis. Bladder is unremarkable. Stomach/Bowel: Stomach is within normal limits. Appendix appears normal. No evidence of bowel wall thickening, distention, or inflammatory changes. Vascular/Lymphatic: Scattered aortic atherosclerosis no enlarged abdominal lymph nodes. Other: No abdominal wall hernia or abnormality. No abdominopelvic ascites. Musculoskeletal: No acute or significant osseous findings. IMPRESSION: 1. Elongated circumferential thickening of the midesophagus, arising at the level of the aortic arch and extending to the left atrium, measuring approximately 8.5 cm in length, consistent with primary esophageal malignancy. 2. There is a subcentimeter superior right paraesophageal lymph node measuring 8 mm, suspicious for nodal metastatic disease. No other enlarged lymph nodes in the chest. 3. No other evidence of metastatic disease in the chest or abdomen. 4. Innumerable very fine centrilobular pulmonary nodules, most numerous in the lung apices, most commonly seen in smoking-related respiratory bronchiolitis. 5. Coronary artery disease.  Aortic Atherosclerosis (ICD10-I70.0). Electronically Signed   By: Eddie Candle M.D.   On: 08/27/2019 12:39       IMPRESSION/PLAN: 1. Squamous Cell Carcinoma of the mid esophagus. Dr. Lisbeth Renshaw discusses the pathology findings and reviews the nature of esophageal cancers. We will follow up with the results of his PET scan and Dr. Lisbeth Renshaw recommends a course of chemoRT for treatment with curative intent. We discussed the risks, benefits, short, and long term effects of radiotherapy, and the patient is interested in proceeding. Dr. Lisbeth Renshaw discusses the delivery and logistics of radiotherapy  and anticipates a course of 5 1/2 weeks of radiotherapy. He will simulate tomorrow and will sign written consent at that time. We anticipate starting treatment on 09/10/19. He will also be referred to CT surgery to discuss possible surgical resection following chemoRT.    Given  current concerns for patient exposure during the COVID-19 pandemic, this encounter was conducted via telephone.  The patient has provided two factor identification and has given verbal consent for this type of encounter and has been advised to only accept a meeting of this type in a secure network environment. The time spent during this encounter was 60 minutes including preparation, discussion, and coordination of the patient's care. The attendants for this meeting include Blenda Nicely, RN, Dr. Lisbeth Renshaw, Hayden Pedro  and Van Clines.  During the encounter,  Blenda Nicely, RN, Dr. Lisbeth Renshaw, and Hayden Pedro were located at [facility] Radiation Oncology Department.  Van Clines was located at home.    The above documentation reflects my direct findings during this shared patient visit. Please see the separate note by Dr. Lisbeth Renshaw on this date for the remainder of the patient's plan of care.    Carola Rhine, PAC

## 2019-08-31 ENCOUNTER — Telehealth: Payer: Self-pay

## 2019-08-31 ENCOUNTER — Ambulatory Visit
Admission: RE | Admit: 2019-08-31 | Discharge: 2019-08-31 | Disposition: A | Discharge status: Home / Self Care | Payer: PPO | Source: Ambulatory Visit | Attending: Radiation Oncology | Admitting: Radiation Oncology

## 2019-08-31 ENCOUNTER — Other Ambulatory Visit: Payer: Self-pay

## 2019-08-31 ENCOUNTER — Encounter (HOSPITAL_COMMUNITY)
Admission: RE | Admit: 2019-08-31 | Discharge: 2019-08-31 | Disposition: A | Discharge status: Home / Self Care | Payer: PPO | Source: Ambulatory Visit | Attending: Oncology | Admitting: Oncology

## 2019-08-31 DIAGNOSIS — C154 Malignant neoplasm of middle third of esophagus: Secondary | ICD-10-CM | POA: Diagnosis not present

## 2019-08-31 DIAGNOSIS — I700 Atherosclerosis of aorta: Secondary | ICD-10-CM | POA: Diagnosis not present

## 2019-08-31 DIAGNOSIS — K5730 Diverticulosis of large intestine without perforation or abscess without bleeding: Secondary | ICD-10-CM | POA: Insufficient documentation

## 2019-08-31 DIAGNOSIS — K228 Other specified diseases of esophagus: Secondary | ICD-10-CM | POA: Diagnosis not present

## 2019-08-31 DIAGNOSIS — I2510 Atherosclerotic heart disease of native coronary artery without angina pectoris: Secondary | ICD-10-CM | POA: Diagnosis not present

## 2019-08-31 LAB — GLUCOSE, CAPILLARY: Glucose-Capillary: 92 mg/dL (ref 70–99)

## 2019-08-31 MED ORDER — FLUDEOXYGLUCOSE F - 18 (FDG) INJECTION
8.7000 | Freq: Once | INTRAVENOUS | Status: AC | PRN
  Administered 2019-08-31: 8.7 via INTRAVENOUS

## 2019-08-31 NOTE — Telephone Encounter (Signed)
-----   Message from Ladell Pier, MD sent at 08/31/2019  1:40 PM EDT ----- Please let him know the PET scan is negative other than the hypermetabolic primary mass and mildly hypermetabolic right paraesophageal node  Plan to present his case at conference next week and decide on indication for an endoscopic ultrasound to confirm advanced stage disease prior to proceeding with chemotherapy/radiation  Follow-up as scheduled

## 2019-08-31 NOTE — Telephone Encounter (Signed)
Spoke to patient's wife Butch Penny to let her know PET scan results.  They will follow up as scheduled.  They are both on the way to have CT simulation this afternoon.

## 2019-09-03 ENCOUNTER — Ambulatory Visit (HOSPITAL_COMMUNITY): Admission: RE | Admit: 2019-09-03 | Payer: PPO | Source: Ambulatory Visit

## 2019-09-04 ENCOUNTER — Other Ambulatory Visit: Payer: Self-pay | Admitting: Gastroenterology

## 2019-09-04 ENCOUNTER — Other Ambulatory Visit: Payer: Self-pay

## 2019-09-04 DIAGNOSIS — C154 Malignant neoplasm of middle third of esophagus: Secondary | ICD-10-CM

## 2019-09-04 NOTE — Progress Notes (Signed)
Placed referral to Dr. Servando Snare and messaged Micki Riley (scheduler) as well.  Sent Radiation Oncology scheduler a message to move his start date out one week.

## 2019-09-04 NOTE — Progress Notes (Signed)
Patient's wife Butch Penny left a message stating they had heard from Dr. Erlinda Hong office and he cannot perform EUS until 8/3.  I checked with Dr. Benay Spice and he desires to push his treatment start date out to 8/9.  I called her back and informed her of this and explained I had made a referral to Dr. Servando Snare and messaged his scheduler in hope he can be seen after the EUS.  She verbalized an understanding.

## 2019-09-05 ENCOUNTER — Other Ambulatory Visit: Payer: Self-pay

## 2019-09-05 ENCOUNTER — Inpatient Hospital Stay: Payer: PPO | Admitting: Nutrition

## 2019-09-05 ENCOUNTER — Encounter: Payer: Self-pay | Admitting: Oncology

## 2019-09-05 NOTE — Progress Notes (Signed)
69 year old male diagnosed with esophageal cancer.  He is being followed by Dr. Benay Spice.  Plan is for tentative concurrent chemoradiation therapy/surgery.  Past medical history includes angioedema, uticaria, Schatzki ring.  Medications include Decadron, Nexium, and Compazine.  Labs were reviewed.  Height: 5 feet 9 inches. Weight: 162 pounds. BMI: 23.92.  Patient reports an allergy to mammal meat.  He does consume chicken or fish or [place] daily. Reports some dysphagia with bread. Reports 4-5 beers/wine daily. Reports diarrhea after drinking Ensure Plus. He is drinking 3-4 bottles of water a day. He has no other nutrition impact symptoms. Reports he is very active.  Nutrition diagnosis: Food and nutrition related knowledge deficit related to esophageal cancer and associated treatments as evidenced by no prior need for nutrition related information.  Intervention: Patient educated on importance of weight maintenance. Reviewed high-calorie, high-protein foods and provided fact sheets. Encourage small frequent meals and snacks. Encouraged patient to drink 2 oral nutrition supplements daily. Recommended patient drink slowly and assess tolerance.  Provided samples. Questions were answered.  Teach back method used.  Contact information given.  Monitoring, evaluation, goals: Patient will tolerate adequate calories and protein to minimize weight loss.  Next visit: To be scheduled as needed with treatment.  **Disclaimer: This note was dictated with voice recognition software. Similar sounding words can inadvertently be transcribed and this note may contain transcription errors which may not have been corrected upon publication of note.**

## 2019-09-05 NOTE — Progress Notes (Signed)
Met with patient at registration to introduce myself as Financial Resource Specialist and to offer available resources.  Discussed one-time $1000 [employer] grant and qualifications to assist with personal expenses while going through treatment.  Gave him my card if interested in applying and for any additional financial questions or concerns.  

## 2019-09-06 ENCOUNTER — Inpatient Hospital Stay: Payer: PPO

## 2019-09-06 ENCOUNTER — Inpatient Hospital Stay: Payer: PPO | Admitting: Oncology

## 2019-09-06 ENCOUNTER — Other Ambulatory Visit: Payer: Self-pay

## 2019-09-06 VITALS — BP 148/91 | HR 82 | Temp 98.6°F | Resp 16 | Ht 69.0 in | Wt 163.1 lb

## 2019-09-06 DIAGNOSIS — C154 Malignant neoplasm of middle third of esophagus: Secondary | ICD-10-CM

## 2019-09-06 DIAGNOSIS — Z85828 Personal history of other malignant neoplasm of skin: Secondary | ICD-10-CM | POA: Diagnosis not present

## 2019-09-06 DIAGNOSIS — I2510 Atherosclerotic heart disease of native coronary artery without angina pectoris: Secondary | ICD-10-CM | POA: Diagnosis not present

## 2019-09-06 LAB — CMP (CANCER CENTER ONLY)
ALT: 13 U/L (ref 0–44)
AST: 16 U/L (ref 15–41)
Albumin: 3.5 g/dL (ref 3.5–5.0)
Alkaline Phosphatase: 80 U/L (ref 38–126)
Anion gap: 9 (ref 5–15)
BUN: 9 mg/dL (ref 8–23)
CO2: 26 mmol/L (ref 22–32)
Calcium: 9.9 mg/dL (ref 8.9–10.3)
Chloride: 99 mmol/L (ref 98–111)
Creatinine: 0.86 mg/dL (ref 0.61–1.24)
GFR, Est AFR Am: >60 mL/min (ref >60)
GFR, Estimated: >60 mL/min (ref >60)
Glucose, Bld: 116 mg/dL — ABNORMAL HIGH (ref 70–99)
Potassium: 3.9 mmol/L (ref 3.5–5.1)
Sodium: 134 mmol/L — ABNORMAL LOW (ref 135–145)
Total Bilirubin: 0.4 mg/dL (ref 0.3–1.2)
Total Protein: 7.2 g/dL (ref 6.5–8.1)

## 2019-09-06 LAB — CBC WITH DIFFERENTIAL (CANCER CENTER ONLY)
Abs Immature Granulocytes: 0.02 10*3/uL (ref 0.00–0.07)
Basophils Absolute: 0 10*3/uL (ref 0.0–0.1)
Basophils Relative: 0 %
Eosinophils Absolute: 0.1 10*3/uL (ref 0.0–0.5)
Eosinophils Relative: 1 %
HCT: 38.6 % — ABNORMAL LOW (ref 39.0–52.0)
Hemoglobin: 13.8 g/dL (ref 13.0–17.0)
Immature Granulocytes: 0 %
Lymphocytes Relative: 19 %
Lymphs Abs: 1.3 10*3/uL (ref 0.7–4.0)
MCH: 34.8 pg — ABNORMAL HIGH (ref 26.0–34.0)
MCHC: 35.8 g/dL (ref 30.0–36.0)
MCV: 97.2 fL (ref 80.0–100.0)
Monocytes Absolute: 0.6 10*3/uL (ref 0.1–1.0)
Monocytes Relative: 8 %
Neutro Abs: 5 10*3/uL (ref 1.7–7.7)
Neutrophils Relative %: 72 %
Platelet Count: 247 10*3/uL (ref 150–400)
RBC: 3.97 MIL/uL — ABNORMAL LOW (ref 4.22–5.81)
RDW: 12 % (ref 11.5–15.5)
WBC Count: 7.1 10*3/uL (ref 4.0–10.5)
nRBC: 0 % (ref 0.0–0.2)

## 2019-09-06 NOTE — Progress Notes (Addendum)
  Will OFFICE PROGRESS NOTE   Diagnosis: Esophagus cancer  INTERVAL HISTORY:   Raymond Hess returns as scheduled.  He continues to have solid dysphagia.  He is tolerating liquids.  No new complaint.  Objective:  Vital signs in last 24 hours:  Blood pressure (!) 148/91, pulse 82, temperature 98.6 F (37 C), temperature source Temporal, resp. rate 16, height '5\' 9"'$  (1.753 m), weight 163 lb 1.6 oz (74 kg), SpO2 100 %.   Physical examination not performed today   Lab Results:  Lab Results  Component Value Date   WBC 7.1 09/06/2019   HGB 13.8 09/06/2019   HCT 38.6 (L) 09/06/2019   MCV 97.2 09/06/2019   PLT 247 09/06/2019   NEUTROABS 5.0 09/06/2019    CMP  Lab Results  Component Value Date   NA 134 (L) 09/06/2019   K 3.9 09/06/2019   CL 99 09/06/2019   CO2 26 09/06/2019   GLUCOSE 116 (H) 09/06/2019   BUN 9 09/06/2019   CREATININE 0.86 09/06/2019   CALCIUM 9.9 09/06/2019   PROT 7.2 09/06/2019   ALBUMIN 3.5 09/06/2019   AST 16 09/06/2019   ALT 13 09/06/2019   ALKPHOS 80 09/06/2019   BILITOT 0.4 09/06/2019   GFRNONAA >60 09/06/2019   GFRAA >60 09/06/2019     Medications: I have reviewed the patient's current medications.   Assessment/Plan: 1. Squamous cell carcinoma of the midesophagus ? Upper endoscopy 08/21/2019-nonobstructing mass in the mid esophagus, biopsy confirmed invasive squamous cell carcinoma, CPS 20 ? CTs 08/27/2019-elongated circumferential thickening of the midesophagus consistent with a primary esophageal malignancy, 8 cm right paraesophageal node suspicious for metastasis, very fine centrilobular pulmonary nodules felt most likely benign ? PET scan 7/37/1062-[id number] hypermetabolic mid esophagus mass, mildly hypermetabolic high right paratracheal paraesophageal node no other evidence of metastatic disease  ? EUS 09/11/2019-fungating mass in the mid esophagus at 26 cm, partially obstructing, tumor invaded the muscularis propria, 2  abnormal lymph nodes were visualized in the middle paraesophageal mediastinum, mass could not be traversed, at [id number], lymph nodes or level 50M-not biopsied secondary to requirement to traverse tumor 2.   Dysphagia/odynophagia secondary to #1 3.   Alphagal meat allergy 4.   History of squamous cell skin cancer    Disposition: Raymond Hess has been diagnosed with squamous cell carcinoma of the esophagus.  The staging PET scan is consistent with a primary esophagus mass and metastatic disease involving a high paraesophageal lymph node.  I reviewed the PET images and discussed treatment options with Raymond Hess and his wife.  Dr. Servando Snare reviewed the case.  He will see Raymond Hess on 09/18/2019.  He recommends a staging EUS.  This is scheduled for next week.  The plan is to proceed with neoadjuvant Taxol/carboplatin and radiation if the EUS confirms a locally advanced tumor.  He may be a candidate for upfront surgery if the EUS suggests very early stage disease.  He is scheduled to begin concurrent chemotherapy and radiation on 09/17/2019.  He will attend a chemotherapy teaching class today.  The plan will be to schedule a restaging evaluation to include a CT and repeat upper endoscopy after the completion of chemotherapy/radiation.    We are waiting on results from PD-L1 testing. Betsy Coder, MD  09/06/2019  4:19 PM

## 2019-09-07 ENCOUNTER — Telehealth: Payer: Self-pay | Admitting: Oncology

## 2019-09-07 ENCOUNTER — Other Ambulatory Visit (HOSPITAL_COMMUNITY)
Admission: RE | Admit: 2019-09-07 | Discharge: 2019-09-07 | Disposition: A | Discharge status: Home / Self Care | Payer: PPO | Source: Ambulatory Visit | Attending: Gastroenterology | Admitting: Gastroenterology

## 2019-09-07 DIAGNOSIS — Z20822 Contact with and (suspected) exposure to covid-19: Secondary | ICD-10-CM | POA: Diagnosis not present

## 2019-09-07 DIAGNOSIS — Z01812 Encounter for preprocedural laboratory examination: Secondary | ICD-10-CM | POA: Insufficient documentation

## 2019-09-07 DIAGNOSIS — C154 Malignant neoplasm of middle third of esophagus: Secondary | ICD-10-CM | POA: Diagnosis not present

## 2019-09-07 LAB — SARS CORONAVIRUS 2 (TAT 6-24 HRS): SARS Coronavirus 2: NEGATIVE

## 2019-09-07 NOTE — Telephone Encounter (Signed)
Scheduled appts per 7/29 los. Pt's spouse confirmed appt dates and times.

## 2019-09-10 ENCOUNTER — Ambulatory Visit: Payer: PPO | Admitting: Radiation Oncology

## 2019-09-10 NOTE — Progress Notes (Signed)
PD-L1 testing results were received from [facility].  A copy was sent to HIM to scan to patient's chart and a copy was given to Dr. Benay Spice for review.

## 2019-09-10 NOTE — Progress Notes (Signed)
Pre op call done for endo procedure tomorrow 09/11/19. Patient confirms he has been quarantined since covid test, will be NPO after midnight, and has a driver taking him home post procedure. All questions addressed.

## 2019-09-10 NOTE — Anesthesia Preprocedure Evaluation (Addendum)
Anesthesia Evaluation  Patient identified by MRN, date of birth, ID band Patient awake    Reviewed: Allergy & Precautions, NPO status , Patient's Chart, lab work & pertinent test results  Airway Mallampati: I       Dental no notable dental hx.    Pulmonary neg pulmonary ROS,    Pulmonary exam normal        Cardiovascular negative cardio ROS Normal cardiovascular exam     Neuro/Psych negative neurological ROS  negative psych ROS   GI/Hepatic Neg liver ROS, GERD  Medicated,  Endo/Other  negative endocrine ROS  Renal/GU negative Renal ROS  negative genitourinary   Musculoskeletal negative musculoskeletal ROS (+)   Abdominal Normal abdominal exam  (+)   Peds  Hematology negative hematology ROS (+)   Anesthesia Other Findings   Reproductive/Obstetrics                            Anesthesia Physical Anesthesia Plan  ASA: II  Anesthesia Plan: MAC   Post-op Pain Management:    Induction:   PONV Risk Score and Plan: Ondansetron  Airway Management Planned: Natural Airway and Mask  Additional Equipment: None  Intra-op Plan:   Post-operative Plan:   Informed Consent: I have reviewed the patients History and Physical, chart, labs and discussed the procedure including the risks, benefits and alternatives for the proposed anesthesia with the patient or authorized representative who has indicated his/her understanding and acceptance.     Dental advisory given  Plan Discussed with: CRNA  Anesthesia Plan Comments:        Anesthesia Quick Evaluation

## 2019-09-11 ENCOUNTER — Telehealth: Payer: Self-pay

## 2019-09-11 ENCOUNTER — Other Ambulatory Visit: Payer: Self-pay

## 2019-09-11 ENCOUNTER — Ambulatory Visit (HOSPITAL_COMMUNITY): Payer: PPO | Admitting: Anesthesiology

## 2019-09-11 ENCOUNTER — Encounter (HOSPITAL_COMMUNITY): Payer: Self-pay | Admitting: Gastroenterology

## 2019-09-11 ENCOUNTER — Encounter (HOSPITAL_COMMUNITY)
Admission: RE | Disposition: A | Discharge status: Home / Self Care | Payer: Self-pay | Source: Home / Self Care | Attending: Gastroenterology

## 2019-09-11 ENCOUNTER — Ambulatory Visit: Payer: PPO

## 2019-09-11 ENCOUNTER — Other Ambulatory Visit: Payer: Self-pay | Admitting: Oncology

## 2019-09-11 ENCOUNTER — Ambulatory Visit: Payer: PPO | Admitting: Radiation Oncology

## 2019-09-11 ENCOUNTER — Ambulatory Visit (HOSPITAL_COMMUNITY)
Admission: RE | Admit: 2019-09-11 | Discharge: 2019-09-11 | Disposition: A | Discharge status: Home / Self Care | Payer: PPO | Attending: Gastroenterology | Admitting: Gastroenterology

## 2019-09-11 DIAGNOSIS — Z809 Family history of malignant neoplasm, unspecified: Secondary | ICD-10-CM | POA: Insufficient documentation

## 2019-09-11 DIAGNOSIS — Z801 Family history of malignant neoplasm of trachea, bronchus and lung: Secondary | ICD-10-CM | POA: Insufficient documentation

## 2019-09-11 DIAGNOSIS — R1310 Dysphagia, unspecified: Secondary | ICD-10-CM | POA: Insufficient documentation

## 2019-09-11 DIAGNOSIS — C154 Malignant neoplasm of middle third of esophagus: Secondary | ICD-10-CM | POA: Insufficient documentation

## 2019-09-11 DIAGNOSIS — I899 Noninfective disorder of lymphatic vessels and lymph nodes, unspecified: Secondary | ICD-10-CM | POA: Diagnosis not present

## 2019-09-11 DIAGNOSIS — I898 Other specified noninfective disorders of lymphatic vessels and lymph nodes: Secondary | ICD-10-CM | POA: Insufficient documentation

## 2019-09-11 DIAGNOSIS — E7800 Pure hypercholesterolemia, unspecified: Secondary | ICD-10-CM | POA: Diagnosis not present

## 2019-09-11 DIAGNOSIS — N400 Enlarged prostate without lower urinary tract symptoms: Secondary | ICD-10-CM | POA: Insufficient documentation

## 2019-09-11 DIAGNOSIS — Z803 Family history of malignant neoplasm of breast: Secondary | ICD-10-CM | POA: Diagnosis not present

## 2019-09-11 DIAGNOSIS — I7300 Raynaud's syndrome without gangrene: Secondary | ICD-10-CM | POA: Diagnosis not present

## 2019-09-11 DIAGNOSIS — K219 Gastro-esophageal reflux disease without esophagitis: Secondary | ICD-10-CM | POA: Insufficient documentation

## 2019-09-11 DIAGNOSIS — C159 Malignant neoplasm of esophagus, unspecified: Secondary | ICD-10-CM | POA: Diagnosis not present

## 2019-09-11 DIAGNOSIS — Z8501 Personal history of malignant neoplasm of esophagus: Secondary | ICD-10-CM | POA: Insufficient documentation

## 2019-09-11 DIAGNOSIS — Z888 Allergy status to other drugs, medicaments and biological substances status: Secondary | ICD-10-CM | POA: Insufficient documentation

## 2019-09-11 DIAGNOSIS — Z79899 Other long term (current) drug therapy: Secondary | ICD-10-CM | POA: Insufficient documentation

## 2019-09-11 HISTORY — PX: ESOPHAGOGASTRODUODENOSCOPY (EGD) WITH PROPOFOL: SHX5813

## 2019-09-11 HISTORY — PX: EUS: SHX5427

## 2019-09-11 SURGERY — UPPER ENDOSCOPIC ULTRASOUND (EUS) LINEAR
Anesthesia: Monitor Anesthesia Care

## 2019-09-11 MED ORDER — SODIUM CHLORIDE 0.9 % IV SOLN: INTRAVENOUS | Status: DC

## 2019-09-11 MED ORDER — PROPOFOL 500 MG/50ML IV EMUL
INTRAVENOUS | Status: DC | PRN
  Administered 2019-09-11: 125 ug/kg/min via INTRAVENOUS

## 2019-09-11 MED ORDER — LIDOCAINE HCL (CARDIAC) PF 100 MG/5ML IV SOSY
PREFILLED_SYRINGE | INTRAVENOUS | Status: DC | PRN
  Administered 2019-09-11: 40 mg via INTRAVENOUS

## 2019-09-11 MED ORDER — PROPOFOL 10 MG/ML IV BOLUS
INTRAVENOUS | Status: DC | PRN
  Administered 2019-09-11 (×9): 20 mg via INTRAVENOUS

## 2019-09-11 MED ORDER — LACTATED RINGERS IV SOLN
INTRAVENOUS | Status: AC | PRN
  Administered 2019-09-11: 1000 mL via INTRAVENOUS

## 2019-09-11 MED ORDER — PROPOFOL 500 MG/50ML IV EMUL
INTRAVENOUS | Status: AC
  Filled 2019-09-11: qty 50

## 2019-09-11 NOTE — H&P (Signed)
Patient interval history reviewed.  Patient examined again.  There has been no change from documented H/P scanned into chart from our office except as documented below.  Assessment:  1.  Squamous cell cancer of esophagus.  Plan:  1.  EUS for staging. 2.  Risks (bleeding, infection, bowel perforation that could require surgery, sedation-related changes in cardiopulmonary systems), benefits (identification and possible treatment of source of symptoms, exclusion of certain causes of symptoms), and alternatives (watchful waiting, radiographic imaging studies, empiric medical treatment) of upper endoscopy with ultrasound (EUS +/- FNA) were explained to patient/family in detail and patient wishes to proceed.

## 2019-09-11 NOTE — Transfer of Care (Signed)
Immediate Anesthesia Transfer of Care Note  Patient: Raymond Hess  Procedure(s) Performed: UPPER ENDOSCOPIC ULTRASOUND (EUS) RADIAL (N/A )  Patient Location: Endoscopy Unit  Anesthesia Type:MAC  Level of Consciousness: awake, alert , oriented and patient cooperative  Airway & Oxygen Therapy: Patient Spontanous Breathing  Post-op Assessment: Report given to RN and Post -op Vital signs reviewed and stable  Post vital signs: Reviewed and stable  Last Vitals:  Vitals Value Taken Time  BP 125/77   Temp    Pulse 85 09/11/19 0912  Resp 16 09/11/19 0912  SpO2 98 % 09/11/19 0912  Vitals shown include unvalidated device data.  Last Pain:  Vitals:   09/11/19 0717  TempSrc: Oral  PainSc: 0-No pain         Complications: No complications documented.

## 2019-09-11 NOTE — Op Note (Signed)
[facility] Patient Name: Raymond Hess Procedure Date: 09/11/2019 MRN: [id number] Attending MD: Arta Silence , MD Date of Birth: [birth year] CSN: [id number] Age: 69 Admit Type: Outpatient Procedure:                Upper EUS Indications:              Esophageal mucosal mass/polyp found on endoscopy,                            Dysphagia Providers:                Arta Silence, MD, Clyde Lundborg, RN, Elspeth Cho Tech., Technician, Theodosia Quay, CRNA Referring MD:             Dr. Julieanne Manson, Dr. Lanelle Bal Medicines:                Monitored Anesthesia Care Complications:            No immediate complications. Estimated Blood Loss:     Estimated blood loss was minimal. Procedure:                Pre-Anesthesia Assessment:                           - Prior to the procedure, a History and Physical                            was performed, and patient medications and                            allergies were reviewed. The patient's tolerance of                            previous anesthesia was also reviewed. The risks                            and benefits of the procedure and the sedation                            options and risks were discussed with the patient.                            All questions were answered, and informed consent                            was obtained. Prior Anticoagulants: The patient has                            taken no previous anticoagulant or antiplatelet                            agents. ASA Grade Assessment: II - A patient with  mild systemic disease. After reviewing the risks                            and benefits, the patient was deemed in                            satisfactory condition to undergo the procedure.                           After obtaining informed consent, the endoscope was                            passed under direct vision. Throughout the                             procedure, the patient's blood pressure, pulse, and                            oxygen saturations were monitored continuously. The                            [id number]-[id number] ([id number]) Olympus Radial EUS was                            introduced through the mouth, and advanced to the                            middle third of esophagus. The [id number] ([id number])                            Olympus gastroscope was introduced through the                            mouth, and advanced to the middle third of                            esophagus. The upper EUS was accomplished without                            difficulty. The patient tolerated the procedure                            well. Scope In: Scope Out: Findings:      ENDOSCOPIC FINDING: :      A large, fungating, submucosal and ulcerating mass with bleeding and no       stigmata of recent bleeding was found in the mid esophagus, 26 cm from       the incisors. The mass was partially obstructing and circumferential.      ENDOSONOGRAPHIC FINDING: :      A hypoechoic mass was found in the middle third of the esophagus. The       mass was encountered at 26 cm from the incisors. The lesion was       circumferential. The endosonographic borders were irregular. There was  sonographic evidence suggesting invasion into the muscularis propria       (Layer 4).      Two abnormal lymph nodes were visualized in the middle paraesophageal       mediastinum (level 27M). The nodes were hypoechoic. 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 Could not traverse                            tumor stricture with either EGD scope or EUS scope.                           - A mass was found in the middle third of the                            esophagus. A tissue diagnosis was obtained prior to                            this exam. This is of squamous  cell carcinoma. This                            was staged at least T3 N1 Mx by endosonographic                            criteria; unable to complete staging exam since I                            could not traverse tumor with the EUS scope.                           - Two abnormal lymph nodes were visualized in the                            middle paraesophageal mediastinum (level 27M). To                            biopsy would require traversing tumor in the                            esophagus, thus biopsies not performed. Moderate Sedation:      None Recommendation:           - Discharge patient to home (ambulatory).                           - Soft diet until further notice.                           - Return to referring physician as previously  scheduled. Procedure Code(s):        --- Professional ---                           [phone], Esophagoscopy, flexible, transoral; with                            endoscopic ultrasound examination Diagnosis Code(s):        --- Professional ---                           C15.4, Malignant neoplasm of middle third of                            esophagus                           I89.9, Noninfective disorder of lymphatic vessels                            and lymph nodes, unspecified                           K22.8, Other specified diseases of esophagus                           R13.10, Dysphagia, unspecified CPT copyright 2019 [employer]. All rights reserved. The codes documented in this report are preliminary and upon coder review may  be revised to meet current compliance requirements. Arta Silence, MD 09/11/2019 9:18:24 AM This report has been signed electronically. Number of Addenda: 0

## 2019-09-11 NOTE — Discharge Instructions (Signed)
YOU HAD AN ENDOSCOPIC PROCEDURE TODAY: Refer to the procedure report and other information in the discharge instructions given to you for any specific questions about what was found during the examination. If this information does not answer your questions, please call [facility] office at [phone] to clarify.   YOU SHOULD EXPECT: Some feelings of bloating in the abdomen. Passage of more gas than usual. Walking can help get rid of the air that was put into your GI tract during the procedure and reduce the bloating. If you had a lower endoscopy (such as a colonoscopy or flexible sigmoidoscopy) you may notice spotting of blood in your stool or on the toilet paper. Some abdominal soreness may be present for a day or two, also.  DIET: Eat soft diet until further notice. Your first meal following the procedure should be a light meal and then it is ok to progress to your normal diet. Heavy or fried foods are harder to digest and may make you feel nauseous or bloated. Drink plenty of fluids but you should avoid alcoholic beverages for 24 hours. If you had a esophageal dilation, please see attached instructions for diet.    ACTIVITY: Your care partner should take you home directly after the procedure. You should plan to take it easy, moving slowly for the rest of the day. You can resume normal activity the day after the procedure however YOU SHOULD NOT DRIVE, use power tools, machinery or perform tasks that involve climbing or major physical exertion for 24 hours (because of the sedation medicines used during the test).   SYMPTOMS TO REPORT IMMEDIATELY: A gastroenterologist can be reached at any hour. Please call [phone]  for any of the following symptoms:   Following upper endoscopy (EGD, EUS, ERCP, esophageal dilation) Vomiting of blood or coffee ground material  New, significant abdominal pain  New, significant chest pain or pain under the shoulder blades  Painful or persistently difficult  swallowing  New shortness of breath  Black, tarry-looking or red, bloody stools  FOLLOW UP:  If any biopsies were taken you will be contacted by phone or by letter within the next 1-3 weeks. Call [phone]  if you have not heard about the biopsies in 3 weeks.  Please also call with any specific questions about appointments or follow up tests.

## 2019-09-11 NOTE — Progress Notes (Signed)
Pharmacist Chemotherapy Monitoring - Initial Assessment    Anticipated start date: 09/12/19   Regimen:   Are orders appropriate based on the patients diagnosis, regimen, and cycle? Yes  Does the plan date match the patients scheduled date? Yes  Is the sequencing of drugs appropriate? Yes  Are the premedications appropriate for the patients regimen? Yes  Prior Authorization for treatment is: Approved o If applicable, is the correct biosimilar selected based on the patient's insurance? not applicable  Organ Function and Labs:  Are dose adjustments needed based on the patient's renal function, hepatic function, or hematologic function? No  Are appropriate labs ordered prior to the start of patient's treatment? Yes  Other organ system assessment, if indicated: N/A  The following baseline labs, if indicated, have been ordered: N/A  Dose Assessment:  Are the drug doses appropriate? Yes  Are the following correct: o Drug concentrations Yes o IV fluid compatible with drug Yes o Administration routes Yes o Timing of therapy Yes  If applicable, does the patient have documented access for treatment and/or plans for port-a-cath placement? unknown  If applicable, have lifetime cumulative doses been properly documented and assessed? not applicable Lifetime Dose Tracking  No doses have been documented on this patient for the following tracked chemicals: Doxorubicin, Epirubicin, Idarubicin, Daunorubicin, Mitoxantrone, Bleomycin, Oxaliplatin, Carboplatin, Liposomal Doxorubicin  o   Toxicity Monitoring/Prevention:  The patient has the following take home antiemetics prescribed: Need rx for home antiemetics  The patient has the following take home medications prescribed: N/A  Medication allergies and previous infusion related reactions, if applicable, have been reviewed and addressed. Yes  The patient's current medication list has been assessed for drug-drug interactions with their  chemotherapy regimen. no significant drug-drug interactions were identified on review.  Order Review:  Are the treatment plan orders signed? No  Is the patient scheduled to see a provider prior to their treatment? No  I verify that I have reviewed each item in the above checklist and answered each question accordingly.  Acquanetta Belling 09/11/2019 12:59 PM

## 2019-09-11 NOTE — Progress Notes (Signed)
Verified with patient's wife that he has Decadron 2 mg tablets at home.  He knows to take 5 tabs (10 Mg ) tonight at 10 pm and then again at 6 am tomorrow (day of first Taxol).  He also has Compazine at home as well.

## 2019-09-11 NOTE — Telephone Encounter (Signed)
Updated medication list

## 2019-09-11 NOTE — Interval H&P Note (Signed)
History and Physical Interval Note:  09/11/2019 8:45 AM  Raymond Hess  has presented today for surgery, with the diagnosis of Esophageal cancer.  The various methods of treatment have been discussed with the patient and family. After consideration of risks, benefits and other options for treatment, the patient has consented to  Procedure(s): UPPER ENDOSCOPIC ULTRASOUND (EUS) LINEAR vs radial (N/A) as a surgical intervention.  The patient's history has been reviewed, patient examined, no change in status, stable for surgery.  I have reviewed the patient's chart and labs.  Questions were answered to the patient's satisfaction.     [name]

## 2019-09-11 NOTE — Anesthesia Postprocedure Evaluation (Signed)
Anesthesia Post Note  Patient: Raymond Hess  Procedure(s) Performed: UPPER ENDOSCOPIC ULTRASOUND (EUS) RADIAL (N/A )     Patient location during evaluation: Endoscopy Anesthesia Type: MAC Level of consciousness: awake Pain management: pain level controlled Vital Signs Assessment: post-procedure vital signs reviewed and stable Respiratory status: spontaneous breathing Cardiovascular status: stable Postop Assessment: no apparent nausea or vomiting Anesthetic complications: no   No complications documented.  Last Vitals:  Vitals:   09/11/19 0930 09/11/19 0940  BP: (!) 159/86 (!) 166/85  Pulse: 71 71  Resp: 16 14  Temp:    SpO2: 98% 98%    Last Pain:  Vitals:   09/11/19 0940  TempSrc:   PainSc: 0-No pain                 Huston Foley

## 2019-09-11 NOTE — Progress Notes (Signed)
Patient's wife calls very upset after he had EUS and hearing from Dr. Paulita Fujita that the mass has grown.  They would like to start Chemo/RT tomorrow.  I have Dr. Benay Spice aware and have coordinated all these changes.  I have spoken to the Butch Penny his wife and offered for them to come in at 3:00 pm to go over things with Dr. Benay Spice and she states they feel comfortable moving forward with the plan as outlined previously.  I have made Dr. Benay Spice aware.

## 2019-09-12 ENCOUNTER — Ambulatory Visit: Payer: PPO

## 2019-09-12 ENCOUNTER — Inpatient Hospital Stay: Payer: PPO | Attending: Radiation Oncology

## 2019-09-12 ENCOUNTER — Other Ambulatory Visit: Payer: Self-pay

## 2019-09-12 ENCOUNTER — Ambulatory Visit
Admission: RE | Admit: 2019-09-12 | Discharge: 2019-09-12 | Disposition: A | Discharge status: Home / Self Care | Payer: PPO | Source: Ambulatory Visit | Attending: Radiation Oncology | Admitting: Radiation Oncology

## 2019-09-12 ENCOUNTER — Encounter (HOSPITAL_COMMUNITY): Payer: Self-pay | Admitting: Gastroenterology

## 2019-09-12 ENCOUNTER — Inpatient Hospital Stay: Payer: PPO

## 2019-09-12 ENCOUNTER — Other Ambulatory Visit: Payer: PPO

## 2019-09-12 VITALS — BP 132/84 | HR 92 | Temp 98.2°F | Resp 17

## 2019-09-12 DIAGNOSIS — R1310 Dysphagia, unspecified: Secondary | ICD-10-CM | POA: Diagnosis not present

## 2019-09-12 DIAGNOSIS — Z510 Encounter for antineoplastic radiation therapy: Secondary | ICD-10-CM | POA: Insufficient documentation

## 2019-09-12 DIAGNOSIS — D709 Neutropenia, unspecified: Secondary | ICD-10-CM | POA: Diagnosis not present

## 2019-09-12 DIAGNOSIS — Z5111 Encounter for antineoplastic chemotherapy: Secondary | ICD-10-CM | POA: Insufficient documentation

## 2019-09-12 DIAGNOSIS — C154 Malignant neoplasm of middle third of esophagus: Secondary | ICD-10-CM | POA: Insufficient documentation

## 2019-09-12 MED ORDER — PALONOSETRON HCL INJECTION 0.25 MG/5ML
INTRAVENOUS | Status: AC
  Filled 2019-09-12: qty 5

## 2019-09-12 MED ORDER — FAMOTIDINE IN NACL 20-0.9 MG/50ML-% IV SOLN
20.0000 mg | Freq: Once | INTRAVENOUS | Status: AC
  Administered 2019-09-12: 20 mg via INTRAVENOUS

## 2019-09-12 MED ORDER — SODIUM CHLORIDE 0.9 % IV SOLN
196.4000 mg | Freq: Once | INTRAVENOUS | Status: AC
  Administered 2019-09-12: 200 mg via INTRAVENOUS
  Filled 2019-09-12: qty 20

## 2019-09-12 MED ORDER — SODIUM CHLORIDE 0.9 % IV SOLN
20.0000 mg | Freq: Once | INTRAVENOUS | Status: AC
  Administered 2019-09-12: 20 mg via INTRAVENOUS
  Filled 2019-09-12: qty 20

## 2019-09-12 MED ORDER — SODIUM CHLORIDE 0.9 % IV SOLN
Freq: Once | INTRAVENOUS | Status: AC
  Filled 2019-09-12: qty 250

## 2019-09-12 MED ORDER — FAMOTIDINE IN NACL 20-0.9 MG/50ML-% IV SOLN
INTRAVENOUS | Status: AC
  Filled 2019-09-12: qty 50

## 2019-09-12 MED ORDER — SODIUM CHLORIDE 0.9 % IV SOLN
50.0000 mg/m2 | Freq: Once | INTRAVENOUS | Status: AC
  Administered 2019-09-12: 96 mg via INTRAVENOUS
  Filled 2019-09-12: qty 16

## 2019-09-12 MED ORDER — PALONOSETRON HCL INJECTION 0.25 MG/5ML
0.2500 mg | Freq: Once | INTRAVENOUS | Status: AC
  Administered 2019-09-12: 0.25 mg via INTRAVENOUS

## 2019-09-12 MED ORDER — DIPHENHYDRAMINE HCL 50 MG/ML IJ SOLN
INTRAMUSCULAR | Status: AC
  Filled 2019-09-12: qty 1

## 2019-09-12 MED ORDER — DIPHENHYDRAMINE HCL 50 MG/ML IJ SOLN
50.0000 mg | Freq: Once | INTRAMUSCULAR | Status: AC
  Administered 2019-09-12: 50 mg via INTRAVENOUS

## 2019-09-12 NOTE — Patient Instructions (Signed)
[facility] Discharge Instructions for Patients Receiving Chemotherapy  Today you received the following chemotherapy agents: Taxol and Carboplatin.  To help prevent nausea and vomiting after your treatment, we encourage you to take your nausea medication as directed.   If you develop nausea and vomiting that is not controlled by your nausea medication, call the clinic.   BELOW ARE SYMPTOMS THAT SHOULD BE REPORTED IMMEDIATELY:  *FEVER GREATER THAN 100.5 F  *CHILLS WITH OR WITHOUT FEVER  NAUSEA AND VOMITING THAT IS NOT CONTROLLED WITH YOUR NAUSEA MEDICATION  *UNUSUAL SHORTNESS OF BREATH  *UNUSUAL BRUISING OR BLEEDING  TENDERNESS IN MOUTH AND THROAT WITH OR WITHOUT PRESENCE OF ULCERS  *URINARY PROBLEMS  *BOWEL PROBLEMS  UNUSUAL RASH Items with * indicate a potential emergency and should be followed up as soon as possible.  Feel free to call the clinic should you have any questions or concerns. The clinic phone number is [phone].  Please show the CHEMO ALERT CARD at check-in to the Emergency Department and triage nurse.  Paclitaxel injection What is this medicine? PACLITAXEL (PAK li TAX el) is a chemotherapy drug. It targets fast dividing cells, like cancer cells, and causes these cells to die. This medicine is used to treat ovarian cancer, breast cancer, lung cancer, Kaposi's sarcoma, and other cancers. This medicine may be used for other purposes; ask your health care provider or pharmacist if you have questions. COMMON BRAND NAME(S): Onxol, Taxol What should I tell my health care provider before I take this medicine? They need to know if you have any of these conditions:  history of irregular heartbeat  liver disease  low blood counts, like low white cell, platelet, or red cell counts  lung or breathing disease, like asthma  tingling of the fingers or toes, or other nerve disorder  an unusual or allergic reaction to paclitaxel, alcohol,  polyoxyethylated castor oil, other chemotherapy, other medicines, foods, dyes, or preservatives  pregnant or trying to get pregnant  breast-feeding How should I use this medicine? This drug is given as an infusion into a vein. It is administered in a hospital or clinic by a specially trained health care professional. Talk to your pediatrician regarding the use of this medicine in children. Special care may be needed. Overdosage: If you think you have taken too much of this medicine contact a poison control center or emergency room at once. NOTE: This medicine is only for you. Do not share this medicine with others. What if I miss a dose? It is important not to miss your dose. Call your doctor or health care professional if you are unable to keep an appointment. What may interact with this medicine? Do not take this medicine with any of the following medications:  disulfiram  metronidazole This medicine may also interact with the following medications:  antiviral medicines for hepatitis, HIV or AIDS  certain antibiotics like erythromycin and clarithromycin  certain medicines for fungal infections like ketoconazole and itraconazole  certain medicines for seizures like carbamazepine, phenobarbital, phenytoin  gemfibrozil  nefazodone  rifampin  St. John's wort This list may not describe all possible interactions. Give your health care provider a list of all the medicines, herbs, non-prescription drugs, or dietary supplements you use. Also tell them if you smoke, drink alcohol, or use illegal drugs. Some items may interact with your medicine. What should I watch for while using this medicine? Your condition will be monitored carefully while you are receiving this medicine. You will need important   blood work done while you are taking this medicine. This medicine can cause serious allergic reactions. To reduce your risk you will need to take other medicine(s) before treatment with this  medicine. If you experience allergic reactions like skin rash, itching or hives, swelling of the face, lips, or tongue, tell your doctor or health care professional right away. In some cases, you may be given additional medicines to help with side effects. Follow all directions for their use. This drug may make you feel generally unwell. This is not uncommon, as chemotherapy can affect healthy cells as well as cancer cells. Report any side effects. Continue your course of treatment even though you feel ill unless your doctor tells you to stop. Call your doctor or health care professional for advice if you get a fever, chills or sore throat, or other symptoms of a cold or flu. Do not treat yourself. This drug decreases your body's ability to fight infections. Try to avoid being around people who are sick. This medicine may increase your risk to bruise or bleed. Call your doctor or health care professional if you notice any unusual bleeding. Be careful brushing and flossing your teeth or using a toothpick because you may get an infection or bleed more easily. If you have any dental work done, tell your dentist you are receiving this medicine. Avoid taking products that contain aspirin, acetaminophen, ibuprofen, naproxen, or ketoprofen unless instructed by your doctor. These medicines may hide a fever. Do not become pregnant while taking this medicine. Women should inform their doctor if they wish to become pregnant or think they might be pregnant. There is a potential for serious side effects to an unborn child. Talk to your health care professional or pharmacist for more information. Do not breast-feed an infant while taking this medicine. Men are advised not to father a child while receiving this medicine. This product may contain alcohol. Ask your pharmacist or healthcare provider if this medicine contains alcohol. Be sure to tell all healthcare providers you are taking this medicine. Certain medicines,  like metronidazole and disulfiram, can cause an unpleasant reaction when taken with alcohol. The reaction includes flushing, headache, nausea, vomiting, sweating, and increased thirst. The reaction can last from 30 minutes to several hours. What side effects may I notice from receiving this medicine? Side effects that you should report to your doctor or health care professional as soon as possible:  allergic reactions like skin rash, itching or hives, swelling of the face, lips, or tongue  breathing problems  changes in vision  fast, irregular heartbeat  high or low blood pressure  mouth sores  pain, tingling, numbness in the hands or feet  signs of decreased platelets or bleeding - bruising, pinpoint red spots on the skin, black, tarry stools, blood in the urine  signs of decreased red blood cells - unusually weak or tired, feeling faint or lightheaded, falls  signs of infection - fever or chills, cough, sore throat, pain or difficulty passing urine  signs and symptoms of liver injury like dark yellow or brown urine; general ill feeling or flu-like symptoms; light-colored stools; loss of appetite; nausea; right upper belly pain; unusually weak or tired; yellowing of the eyes or skin  swelling of the ankles, feet, hands  unusually slow heartbeat Side effects that usually do not require medical attention (report to your doctor or health care professional if they continue or are bothersome):  diarrhea  hair loss  loss of appetite  muscle or joint   pain  nausea, vomiting  pain, redness, or irritation at site where injected  tiredness This list may not describe all possible side effects. Call your doctor for medical advice about side effects. You may report side effects to FDA at 1-800-FDA-1088. Where should I keep my medicine? This drug is given in a hospital or clinic and will not be stored at home. NOTE: This sheet is a summary. It may not cover all possible information.  If you have questions about this medicine, talk to your doctor, pharmacist, or health care provider.  2020 Elsevier/Gold Standard (2016-09-28 13:14:55) Carboplatin injection What is this medicine? CARBOPLATIN (KAR boe pla tin) is a chemotherapy drug. It targets fast dividing cells, like cancer cells, and causes these cells to die. This medicine is used to treat ovarian cancer and many other cancers. This medicine may be used for other purposes; ask your health care provider or pharmacist if you have questions. COMMON BRAND NAME(S): Paraplatin What should I tell my health care provider before I take this medicine? They need to know if you have any of these conditions:  blood disorders  hearing problems  kidney disease  recent or ongoing radiation therapy  an unusual or allergic reaction to carboplatin, cisplatin, other chemotherapy, other medicines, foods, dyes, or preservatives  pregnant or trying to get pregnant  breast-feeding How should I use this medicine? This drug is usually given as an infusion into a vein. It is administered in a hospital or clinic by a specially trained health care professional. Talk to your pediatrician regarding the use of this medicine in children. Special care may be needed. Overdosage: If you think you have taken too much of this medicine contact a poison control center or emergency room at once. NOTE: This medicine is only for you. Do not share this medicine with others. What if I miss a dose? It is important not to miss a dose. Call your doctor or health care professional if you are unable to keep an appointment. What may interact with this medicine?  medicines for seizures  medicines to increase blood counts like filgrastim, pegfilgrastim, sargramostim  some antibiotics like amikacin, gentamicin, neomycin, streptomycin, tobramycin  vaccines Talk to your doctor or health care professional before taking any of these  medicines:  acetaminophen  aspirin  ibuprofen  ketoprofen  naproxen This list may not describe all possible interactions. Give your health care provider a list of all the medicines, herbs, non-prescription drugs, or dietary supplements you use. Also tell them if you smoke, drink alcohol, or use illegal drugs. Some items may interact with your medicine. What should I watch for while using this medicine? Your condition will be monitored carefully while you are receiving this medicine. You will need important blood work done while you are taking this medicine. This drug may make you feel generally unwell. This is not uncommon, as chemotherapy can affect healthy cells as well as cancer cells. Report any side effects. Continue your course of treatment even though you feel ill unless your doctor tells you to stop. In some cases, you may be given additional medicines to help with side effects. Follow all directions for their use. Call your doctor or health care professional for advice if you get a fever, chills or sore throat, or other symptoms of a cold or flu. Do not treat yourself. This drug decreases your body's ability to fight infections. Try to avoid being around people who are sick. This medicine may increase your risk to bruise or   bleed. Call your doctor or health care professional if you notice any unusual bleeding. Be careful brushing and flossing your teeth or using a toothpick because you may get an infection or bleed more easily. If you have any dental work done, tell your dentist you are receiving this medicine. Avoid taking products that contain aspirin, acetaminophen, ibuprofen, naproxen, or ketoprofen unless instructed by your doctor. These medicines may hide a fever. Do not become pregnant while taking this medicine. Women should inform their doctor if they wish to become pregnant or think they might be pregnant. There is a potential for serious side effects to an unborn child. Talk  to your health care professional or pharmacist for more information. Do not breast-feed an infant while taking this medicine. What side effects may I notice from receiving this medicine? Side effects that you should report to your doctor or health care professional as soon as possible:  allergic reactions like skin rash, itching or hives, swelling of the face, lips, or tongue  signs of infection - fever or chills, cough, sore throat, pain or difficulty passing urine  signs of decreased platelets or bleeding - bruising, pinpoint red spots on the skin, black, tarry stools, nosebleeds  signs of decreased red blood cells - unusually weak or tired, fainting spells, lightheadedness  breathing problems  changes in hearing  changes in vision  chest pain  high blood pressure  low blood counts - This drug may decrease the number of white blood cells, red blood cells and platelets. You may be at increased risk for infections and bleeding.  nausea and vomiting  pain, swelling, redness or irritation at the injection site  pain, tingling, numbness in the hands or feet  problems with balance, talking, walking  trouble passing urine or change in the amount of urine Side effects that usually do not require medical attention (report to your doctor or health care professional if they continue or are bothersome):  hair loss  loss of appetite  metallic taste in the mouth or changes in taste This list may not describe all possible side effects. Call your doctor for medical advice about side effects. You may report side effects to FDA at 1-800-FDA-1088. Where should I keep my medicine? This drug is given in a hospital or clinic and will not be stored at home. NOTE: This sheet is a summary. It may not cover all possible information. If you have questions about this medicine, talk to your doctor, pharmacist, or health care provider.  2020 Elsevier/Gold Standard (2007-05-02 14:38:05)  

## 2019-09-12 NOTE — Progress Notes (Signed)
Ok to treat with labs from 7/29 per MD Sherrill.

## 2019-09-13 ENCOUNTER — Ambulatory Visit: Payer: PPO

## 2019-09-13 ENCOUNTER — Telehealth: Payer: Self-pay | Admitting: *Deleted

## 2019-09-13 ENCOUNTER — Ambulatory Visit
Admission: RE | Admit: 2019-09-13 | Discharge: 2019-09-13 | Disposition: A | Discharge status: Home / Self Care | Payer: PPO | Source: Ambulatory Visit | Attending: Radiation Oncology | Admitting: Radiation Oncology

## 2019-09-13 ENCOUNTER — Other Ambulatory Visit: Payer: Self-pay

## 2019-09-13 DIAGNOSIS — Z510 Encounter for antineoplastic radiation therapy: Secondary | ICD-10-CM | POA: Diagnosis not present

## 2019-09-13 DIAGNOSIS — C154 Malignant neoplasm of middle third of esophagus: Secondary | ICD-10-CM | POA: Diagnosis not present

## 2019-09-13 NOTE — Telephone Encounter (Signed)
Called pt to see how he did with his treatment yest.  He reports doing well with no side effects.  Reminded to call with any concerns & he expressed understanding.

## 2019-09-13 NOTE — Telephone Encounter (Signed)
-----   Message from Rolene Course, RN sent at 09/12/2019  3:36 PM EDT ----- Regarding: Benay Spice 1st Tx F/U call - taxol/carbo Sherrill 1st Tx F/U call - taxol/carbo

## 2019-09-14 ENCOUNTER — Ambulatory Visit
Admission: RE | Admit: 2019-09-14 | Discharge: 2019-09-14 | Disposition: A | Discharge status: Home / Self Care | Payer: PPO | Source: Ambulatory Visit | Attending: Radiation Oncology | Admitting: Radiation Oncology

## 2019-09-14 ENCOUNTER — Ambulatory Visit: Payer: PPO

## 2019-09-14 ENCOUNTER — Other Ambulatory Visit: Payer: Self-pay

## 2019-09-14 DIAGNOSIS — C154 Malignant neoplasm of middle third of esophagus: Secondary | ICD-10-CM | POA: Diagnosis not present

## 2019-09-14 DIAGNOSIS — Z510 Encounter for antineoplastic radiation therapy: Secondary | ICD-10-CM | POA: Diagnosis not present

## 2019-09-14 MED ORDER — SONAFINE EX EMUL
1.0000 "application " | Freq: Once | CUTANEOUS | Status: AC
  Administered 2019-09-14: 1 via TOPICAL

## 2019-09-14 NOTE — Progress Notes (Signed)
Pt here for patient teaching.  Pt given Radiation and You booklet, skin care instructions and Sonafine.  Reviewed areas of pertinence such as fatigue, hair loss, skin changes and throat changes . Pt able to give teach back of to pat skin and use unscented/gentle soap,apply Sonafine bid and avoid applying anything to skin within 4 hours of treatment. Pt verbalizes understanding of information given and will contact nursing with any questions or concerns.     Kirsten Mckone M. Lamis Behrmann RN, BSN     

## 2019-09-16 NOTE — Progress Notes (Addendum)
[place]   Telephone:(336) [phone] Fax:(336) [phone]   Clinic Follow up Note   Patient Care Team: Alroy Dust, L.Marlou Sa, MD as PCP - General (Family Medicine) Karie Mainland, RD as Dietitian (Nutrition) 09/18/2019  CHIEF COMPLAINT: F/u esophagus cancer   CURRENT THERAPY: Neoadjuvant concurrent chemoradiation with taxol and carboplatin starting 09/12/19  INTERVAL HISTORY: Raymond Hess returns for f/u and treatment as scheduled. He completed one week for chemoRT.  He remains very active.  He continues to have moderate dysphagia, odynophagia, and esophageal pain that is worse at night.  Carafate has helped significantly.  He takes occasional Tylenol and NSAID without much relief. He tolerates a regular soft diet if he eats slowly and chews thoroughly.   He has mild constipation, last BM 1 day ago takes MiraLAX once daily.  Denies nausea or vomiting.  Denies mucositis.  He coughs clear secretions, denies chest pain, dyspnea, fever, chills.  Denies neuropathy.  Denies signs of chemo reaction.   MEDICAL HISTORY:  Past Medical History:  Diagnosis Date  . Angio-edema   . Urticaria     SURGICAL HISTORY: Past Surgical History:  Procedure Laterality Date  . CATARACT EXTRACTION, BILATERAL Bilateral   . ESOPHAGOGASTRODUODENOSCOPY (EGD) WITH PROPOFOL N/A 09/11/2019   Procedure: ESOPHAGOGASTRODUODENOSCOPY (EGD) WITH PROPOFOL;  Surgeon: Arta Silence, MD;  Location: [facility];  Service: Endoscopy;  Laterality: N/A;  . EUS N/A 09/11/2019   Procedure: UPPER ENDOSCOPIC ULTRASOUND (EUS) RADIAL;  Surgeon: Arta Silence, MD;  Location: [facility];  Service: Endoscopy;  Laterality: N/A;    I have reviewed the social history and family history with the patient and they are unchanged from previous note.  ALLERGIES:  is allergic to other.  MEDICATIONS:  Current Outpatient Medications  Medication Sig Dispense Refill  . acetaminophen (TYLENOL) 325 MG tablet Take 650 mg by mouth every 6  (six) hours as needed.    Marland Kitchen esomeprazole (NEXIUM) 40 MG capsule Take 40 mg by mouth daily.     Marland Kitchen ibuprofen (ADVIL) 200 MG tablet Take 200 mg by mouth every 6 (six) hours as needed for mild pain or moderate pain.    . Polyethylene Glycol 3350 (MIRALAX PO) Take 17 g by mouth daily.    . sucralfate (CARAFATE) 1 g tablet Crush and mix in 1 oz water and drink 5 min TID before meals and at HS for radiation induced esophagitis 120 tablet 2  . ibuprofen (ADVIL) 200 MG tablet Take 400 mg by mouth every 6 (six) hours as needed. (Patient not taking: Reported on 09/18/2019)    . prochlorperazine (COMPAZINE) 10 MG tablet Take 10 mg by mouth every 6 (six) hours as needed. (Patient not taking: Reported on 09/18/2019)     No current facility-administered medications for this visit.   Facility-Administered Medications Ordered in Other Visits  Medication Dose Route Frequency Provider Last Rate Last Admin  . CARBOplatin (PARAPLATIN) 200 mg in sodium chloride 0.9 % 250 mL chemo infusion  200 mg Intravenous Once Ladell Pier, MD      . dexamethasone (DECADRON) 10 mg in sodium chloride 0.9 % 50 mL IVPB  10 mg Intravenous Once Cira Rue K, NP      . PACLitaxel (TAXOL) 96 mg in sodium chloride 0.9 % 250 mL chemo infusion (</= 50m/m2)  50 mg/m2 (Treatment Plan Recorded) Intravenous Once SLadell Pier MD        PHYSICAL EXAMINATION:  Vitals:   09/18/19 0849  BP: 123/73  Pulse: 80  Resp: 18  Temp: (!) 96.3 F (35.7 C)  SpO2: 100%   Filed Weights   09/18/19 0849  Weight: 160 lb (72.6 kg)    GENERAL:alert, no distress and comfortable SKIN: No rash to exposed skin EYES:  sclera clear NECK: Without mass LUNGS: clear with normal breathing effort HEART: regular rate & rhythm, no lower extremity edema ABDOMEN: abdomen soft, non-tender and normal bowel sounds. No hepatomegaly NEURO: alert & oriented x 3 with fluent speech  LABORATORY DATA:  I have reviewed the data as listed CBC Latest Ref Rng &  Units 09/18/2019 09/06/2019  WBC 4.0 - 10.5 K/uL 5.1 7.1  Hemoglobin 13.0 - 17.0 g/dL 13.4 13.8  Hematocrit 39 - 52 % 38.3(L) 38.6(L)  Platelets 150 - 400 K/uL 199 247     CMP Latest Ref Rng & Units 09/18/2019 09/06/2019  Glucose 70 - 99 mg/dL 114(H) 116(H)  BUN 8 - 23 mg/dL 11 9  Creatinine 0.61 - 1.24 mg/dL 0.76 0.86  Sodium 135 - 145 mmol/L 132(L) 134(L)  Potassium 3.5 - 5.1 mmol/L 4.2 3.9  Chloride 98 - 111 mmol/L 99 99  CO2 22 - 32 mmol/L 25 26  Calcium 8.9 - 10.3 mg/dL 9.8 9.9  Total Protein 6.5 - 8.1 g/dL 6.9 7.2  Total Bilirubin 0.3 - 1.2 mg/dL 0.8 0.4  Alkaline Phos 38 - 126 U/L 69 80  AST 15 - 41 U/L 12(L) 16  ALT 0 - 44 U/L 11 13      RADIOGRAPHIC STUDIES: I have personally reviewed the radiological images as listed and agreed with the findings in the report. No results found.   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 Upper endoscopy 08/21/2019-nonobstructing mass in the mid esophagus, biopsy confirmed invasive squamous cell carcinoma, CPS 20 ? CTs 08/27/2019-elongated circumferential thickening of the midesophagus consistent with a primary esophageal malignancy, 8 cm right paraesophageal node suspicious for metastasis, very fine centrilobular pulmonary nodules felt most likely benign ? PET scan 8/76/8115-[id number] hypermetabolic mid esophagus mass, mildly hypermetabolic high right paratracheal paraesophageal node no other evidence of metastatic disease  ? EUS 09/11/2019-fungating mass in the mid esophagus at 26 cm, partially obstructing, tumor invaded the muscularis propria, 2 abnormal lymph nodes were visualized in the middle paraesophageal mediastinum, mass could not be traversed, at [id number], lymph nodes or level 63M-not biopsied secondary to requirement to traverse tumor ? Began neoadjuvant concurrent chemoradiation with taxol 50 mg/m2 and carboplatin AUC 2 on 09/12/2019  ? Day 1 taxol/carbo 09/12/19 ? Day 8 taxol/carbo 09/18/19   2.Dysphagia/odynophagia secondary to #1 3.Alphagalmeat allergy 4.History of squamous cell skin cancer  Disposition: Mr. Wendee Copp appears stable.  He completed week 1 ccRT with Taxol/carboplatin.  He tolerates treatment well with mild constipation.  I recommend to continue MiraLAX once daily, can increase to BID and/or add stool softener if constipation worsens.  He continues to have moderate dysphagia and odynophagia, he began Carafate which is helping.  No significant weight loss, we will continue to monitor carefully.  We discussed expected side effects of chemoRT and the likelihood that these symptoms may worsen before they ultimately improve.  He knows to let us or rad onc know if he needs stronger pain medicine.   We again discussed his EUS which confirms locally advanced disease.  The plan is to continue neoadjuvant chemoradiation.  He will see Dr. Servando Snare for surgical consult today.  We reviewed his PD-L1 result which shows a CPS of 20, indicating a potential benefit with immunotherapy if he  has residual, recurrent, and/or metastatic disease in the future.  Labs adequate to proceed with week 2 Taxol/carbo today.  Given that he had no signs of allergic reaction with his first dose we will decrease the Benadryl and Decadron today.  He will return for lab and chemo next week, follow-up with treatment in 2 weeks.  The patient was seen with Dr. Benay Spice today.   Orders Placed This Encounter  Procedures  . CBC with Differential (Cancer Center Only)    Standing Status:   Future    Standing Expiration Date:   09/17/2020  . CMP ([place] only)    Standing Status:   Future    Standing Expiration Date:   09/17/2020   All questions were answered. The patient knows to call the clinic with any problems, questions or concerns. No barriers to learning were detected.     Alla Feeling, NP 09/18/19  This was a shared visit with Cira Rue. Mr. Jump appears to be tolerating the  chemotherapy and radiation well.  We discussed the PD-L1 result and implication of this with Mr. Gentle and his wife.  We discussed the EUS findings.  The plan is to continue neoadjuvant Taxol/carboplatin and radiation.  He is scheduled to see Dr. Servando Snare this week.  We will consider treatment with immunotherapy if there is residual disease after neoadjuvant therapy and/or surgery.  Julieanne Manson, MD  Julieanne Manson, MD

## 2019-09-17 ENCOUNTER — Ambulatory Visit
Admission: RE | Admit: 2019-09-17 | Discharge: 2019-09-17 | Disposition: A | Discharge status: Home / Self Care | Payer: PPO | Source: Ambulatory Visit | Attending: Radiation Oncology | Admitting: Radiation Oncology

## 2019-09-17 ENCOUNTER — Other Ambulatory Visit: Payer: Self-pay

## 2019-09-17 ENCOUNTER — Other Ambulatory Visit: Payer: Self-pay | Admitting: Radiation Oncology

## 2019-09-17 ENCOUNTER — Ambulatory Visit: Payer: PPO | Admitting: Radiation Oncology

## 2019-09-17 ENCOUNTER — Telehealth: Payer: Self-pay | Admitting: *Deleted

## 2019-09-17 DIAGNOSIS — Z510 Encounter for antineoplastic radiation therapy: Secondary | ICD-10-CM | POA: Diagnosis not present

## 2019-09-17 MED ORDER — SUCRALFATE 1 G PO TABS: ORAL_TABLET | ORAL | 2 refills | Status: DC

## 2019-09-17 MED FILL — Dexamethasone Sodium Phosphate Inj 100 MG/10ML: INTRAMUSCULAR | Qty: 2 | Status: AC

## 2019-09-17 NOTE — Progress Notes (Signed)
[address]       [place] [address]             [phone]                    Raymond Hess [facility] Medical Record #[id number] Date of Birth: October 22, 1950  Referring: Ladell Pier, MD Primary Care: Aurea Graff.Marlou Sa, MD Primary Cardiologist: No primary care provider on file.  Chief Complaint:    Chief Complaint  Patient presents with  . Esophageal Cancer    MIDDLE THIRD per BX ...[place] per Dr Luan Pulling, CT CHEST 08/23/19, PET 08/31/19, EUS 09/11/19 per DR. OUTLAW    History of Present Illness:    CORDALE Hess 69 y.o. male is seen in the office  today for for evaluation of admitted esophageal squamous cell carcinoma.  Patient notes that several months ago he developed right jaw and upper neck pain, initially was most concerned that he had an abscessed tooth.  After several evaluations there was no evidence of tooth abscess, Signs and Symptoms of COVID-19 (e.g., fever, dry cough, and/or SOB), no diagnosis made (assign the appropriate code(s)):    Progressed and he began began having difficulty swallowing solid food.  He also had some epigastric pain.  Because of the symptoms he was referred for upper GI endoscopy-unfortunately this took 2 months to get accomplished but we have done demonstrated extensive mid esophageal cancer-biopsy showed squamous cell per records.  Attempted esophageal ultrasound was not completely successful because the scope would not pass through the mass.  Patient has started chemotherapy and radiation and is referred by Dr. Benay Spice for consideration of surgical resection.     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  He notes alcohol exposure in the past typically 2 beers a day and none recently.  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   Zubrod Score: At the time of surgery this patient's most appropriate activity status/level should be described as: [x]     0    Normal activity, no symptoms []     1    Restricted in physical strenuous activity but ambulatory, able to do out light work []     2    Ambulatory and capable of self care, unable to do work activities, up and about               >50 % of waking hours                              []     3    Only limited self care, in bed greater than 50% of waking hours []     4    Completely disabled, no self care, confined to bed or chair []     5    Moribund   Past Medical History:  Diagnosis Date  . Angio-edema   . Urticaria     Past Surgical History:  Procedure Laterality Date  . CATARACT EXTRACTION, BILATERAL Bilateral   . ESOPHAGOGASTRODUODENOSCOPY (EGD) WITH PROPOFOL N/A 09/11/2019   Procedure: ESOPHAGOGASTRODUODENOSCOPY (EGD) WITH PROPOFOL;  Surgeon: Arta Silence, MD;  Location:  [facility];  Service: Endoscopy;  Laterality: N/A;  . EUS N/A 09/11/2019   Procedure: UPPER ENDOSCOPIC ULTRASOUND (EUS) RADIAL;  Surgeon: Arta Silence, MD;  Location: [facility];  Service: Endoscopy;  Laterality: N/A;    Family history: Patient notes that his father died at age 51 of lung cancer metastatic to the brain, his mother died at age 35 He has 1 sister died at age 17 she had a history of breast cancer and ultimately died of a neuroendocrine tumor, patient has no biologic children   Social History   Tobacco Use  Smoking Status Never Smoker  Smokeless Tobacco Never Used    Social History   Substance and Sexual Activity  Alcohol Use Yes  . Alcohol/week: 2.0 standard drinks  . Types: 1 Glasses of wine, 1 Cans of beer per week     Allergies  Allergen Reactions  . Other Hives    Alphagal (mammal meat)    Current Outpatient Medications    Medication Sig Dispense Refill  . acetaminophen (TYLENOL) 325 MG tablet Take 650 mg by mouth every 6 (six) hours as needed.    Marland Kitchen esomeprazole (NEXIUM) 40 MG capsule Take 40 mg by mouth daily.     Marland Kitchen ibuprofen (ADVIL) 200 MG tablet Take 200 mg by mouth every 6 (six) hours as needed for mild pain or moderate pain.    Marland Kitchen ibuprofen (ADVIL) 200 MG tablet Take 400 mg by mouth every 6 (six) hours as needed.     . Polyethylene Glycol 3350 (MIRALAX PO) Take 17 g by mouth daily.    . prochlorperazine (COMPAZINE) 10 MG tablet Take 10 mg by mouth every 6 (six) hours as needed.     . sucralfate (CARAFATE) 1 g tablet Crush and mix in 1 oz water and drink 5 min TID before meals and at HS for radiation induced esophagitis 120 tablet 2   No current facility-administered medications for this visit.    Pertinent items are noted in HPI.   Review of Systems:     Cardiac Review of Systems: [Y] = yes  or   [ N ] = no   Chest Pain [  n  ]  Resting SOB [n   ] Exertional SOB  [n  ]  Orthopnea [ n ]   Pedal Edema [ n  ]    Palpitations [n  ] Syncope  Florencio.Farrier  ]   Presyncope [ n  ]   General Review of Systems: [Y] = yes [  ]=no Constitional: recent weight change Blue.Reese  ];  Wt loss over the last 3 months [ 5  ] anorexia [  ]; fatigue Blue.Reese  ]; nausea [  ]; night sweats [  ]; fever [  ]; or chills [  ];           Eye : blurred vision [  ]; diplopia [   ]; vision changes [  ];  Amaurosis fugax[  ]; Resp: cough [  ];  wheezing[  ];  hemoptysis[  ]; shortness of breath[  ]; paroxysmal nocturnal dyspnea[  ]; dyspnea on exertion[  ]; or orthopnea[  ];  GI:  gallstones[  ], vomiting[  ];  dysphagia[y  ]; melena[  ];  hematochezia [  ]; heartburn[ y ];   Hx of  Colonoscopy[ y ]; GU: kidney stones [  ]; hematuria[  ];   dysuria [  ];  nocturia[  ];  history of     obstruction [  ];  urinary frequency [  ]             Skin: rash, swelling[  ];, hair loss[  ];  peripheral edema[  ];  or itching[  ]; Musculosketetal: myalgias[  ];  joint  swelling[  ];  joint erythema[  ];  joint pain[  ];  back pain[  ];  Heme/Lymph: bruising[  ];  bleeding[  ];  anemia[  ];  Neuro: TIA[  ];  headaches[  ];  stroke[  ];  vertigo[  ];  seizures[  ];   paresthesias[  ];  difficulty walking[  ];  Psych:depression[  ]; anxiety[  ];  Endocrine: diabetes[  ];  thyroid dysfunction[  ];  Immunizations: Flu up to date Blue.Reese  ]; Pneumococcal up to date [ y ];  Other:     PHYSICAL EXAMINATION: BP 128/86 (BP Location: Left Arm, Patient Position: Sitting, Cuff Size: Normal)   Pulse 90   Temp 97.7 F (36.5 C)   Resp 16   Ht 5\' 10"  (1.778 m)   Wt 160 lb (72.6 kg)   SpO2 99% Comment: RA  BMI 22.96 kg/m  General appearance: alert, cooperative and no distress Head: Normocephalic, without obvious abnormality, atraumatic Neck: no adenopathy, no carotid bruit, no JVD, supple, symmetrical, trachea midline and thyroid not enlarged, symmetric, no tenderness/mass/nodules Lymph nodes: Cervical, supraclavicular, and axillary nodes normal. Resp: clear to auscultation bilaterally Back: symmetric, no curvature. ROM normal. No CVA tenderness. Cardio: regular rate and rhythm, S1, S2 normal, no murmur, click, rub or gallop GI: soft, non-tender; bowel sounds normal; no masses,  no organomegaly Extremities: extremities normal, atraumatic, no cyanosis or edema and Homans sign is negative, no sign of DVT Neurologic: Grossly normal  Diagnostic Studies & Laboratory data:     Recent Radiology Findings:    CT ABDOMEN W CONTRAST/ CT CHEST W CONTRAST  Result Date: 08/27/2019 CLINICAL DATA:  Esophageal mass, initial staging EXAM: CT CHEST AND ABDOMEN WITH CONTRAST TECHNIQUE: Multidetector CT imaging of the chest and abdomen was performed following the standard protocol during bolus administration of intravenous contrast. CONTRAST:  184mL OMNIPAQUE IOHEXOL 300 MG/ML SOLN, additional oral enteric contrast COMPARISON:  None. FINDINGS: CT CHEST FINDINGS Cardiovascular: Left  coronary artery calcifications. Normal heart size. No pericardial effusion. Mediastinum/Nodes: There is a subcentimeter superior right paraesophageal lymph node measuring 8 mm (series 2, image 10). No other enlarged lymph nodes in the chest. Elongated circumferential thickening of the midesophagus, arising at the level of the aortic arch and extending to the left atrium, measuring approximately 8.5 cm in length (series 2, image 24, series 6, image 83). Thyroid gland and trachea demonstrate no significant findings. Lungs/Pleura: Innumerable very fine centrilobular pulmonary nodules, most numerous in the lung apices. No pleural effusion or pneumothorax. Musculoskeletal: No chest wall mass or suspicious bone lesions identified. CT ABDOMEN PELVIS FINDINGS Hepatobiliary: No solid liver abnormality is seen. No gallstones, gallbladder wall thickening, or biliary dilatation. Pancreas: Unremarkable. No pancreatic ductal dilatation or surrounding inflammatory changes. Spleen: Normal in size without significant abnormality. Adrenals/Urinary Tract: Adrenal glands are unremarkable. Kidneys are normal, without renal calculi, solid lesion, or hydronephrosis. Bladder is unremarkable. Stomach/Bowel: Stomach is within normal limits. Appendix appears normal. No evidence of bowel wall thickening, distention, or inflammatory changes. Vascular/Lymphatic: Scattered aortic atherosclerosis no enlarged abdominal lymph nodes. Other: No abdominal wall hernia or abnormality. No abdominopelvic ascites. Musculoskeletal: No acute or significant osseous findings. IMPRESSION: 1. Elongated circumferential thickening of the midesophagus, arising at the level of the  aortic arch and extending to the left atrium, measuring approximately 8.5 cm in length, consistent with primary esophageal malignancy. 2. There is a subcentimeter superior right paraesophageal lymph node measuring 8 mm, suspicious for nodal metastatic disease. No other enlarged lymph nodes  in the chest. 3. No other evidence of metastatic disease in the chest or abdomen. 4. Innumerable very fine centrilobular pulmonary nodules, most numerous in the lung apices, most commonly seen in smoking-related respiratory bronchiolitis. 5. Coronary artery disease.  Aortic Atherosclerosis (ICD10-I70.0). Electronically Signed   By: Eddie Candle M.D.   On: 08/27/2019 12:39   NM PET Image Initial (PI) Skull Base To Thigh  Result Date: 08/31/2019 CLINICAL DATA:  Initial treatment strategy for middle third of esophagus mass. EXAM: NUCLEAR MEDICINE PET SKULL BASE TO THIGH TECHNIQUE: 8.7 mCi F-18 FDG was injected intravenously. Full-ring PET imaging was performed from the skull base to thigh after the radiotracer. CT data was obtained and used for attenuation correction and anatomic localization. Fasting blood glucose: 92 mg/dl COMPARISON:  08/27/2019 CT chest and abdomen. FINDINGS: Mediastinal blood pool activity: SUV max 2.8 Liver activity: SUV max NA NECK: No hypermetabolic lymph nodes in the neck. Incidental CT findings: none CHEST: Intensely hypermetabolic annular esophageal mass centered in the middle third of the thoracic esophagus measuring up to 3.8 x 3.0 cm in axial dimensions and 8.7 cm craniocaudal with max SUV 31.1 (series 4/image 76). High right paraesophageal 0.7 cm lymph node demonstrates mild hypermetabolism with max SUV 3.6 (series 4/image 56). No additional hypermetabolic axillary, mediastinal or hilar lymph nodes. No hypermetabolic pulmonary findings. Incidental CT findings: Coronary atherosclerosis. No acute consolidative airspace disease, lung masses or significant pulmonary nodules. Previously described fine centrilobular micronodularity in the upper lobes on the prior chest CT is not appreciated on these low-dose CT images. ABDOMEN/PELVIS: No abnormal hypermetabolic activity within the liver, pancreas, adrenal glands, or spleen. No hypermetabolic lymph nodes in the abdomen or pelvis. Incidental  CT findings: Mild left colonic diverticulosis. Mildly atherosclerotic nonaneurysmal abdominal aorta. SKELETON: No focal hypermetabolic activity to suggest skeletal metastasis. Incidental CT findings: none IMPRESSION: 1. Intensely hypermetabolic (max SUV 31) annular 3.8 x 3.0 x 8.7 cm mass centered in the middle third of the thoracic esophagus compatible with primary esophageal malignancy. 2. Mildly hypermetabolic high right paraesophageal lymph node compatible with nodal metastasis. 3. No additional sites of hypermetabolic metastatic disease. 4. Chronic findings include: Aortic Atherosclerosis (ICD10-I70.0). Coronary atherosclerosis. Mild left colonic diverticulosis. Electronically Signed   By: Ilona Sorrel M.D.   On: 08/31/2019 09:15     I have independently reviewed the above radiology studies  and reviewed the findings with the patient.   Recent Lab Findings: Lab Results  Component Value Date   WBC 5.1 09/18/2019   HGB 13.4 09/18/2019   HCT 38.3 (L) 09/18/2019   PLT 199 09/18/2019   GLUCOSE 114 (H) 09/18/2019   ALT 11 09/18/2019   AST 12 (L) 09/18/2019   NA 132 (L) 09/18/2019   K 4.2 09/18/2019   CL 99 09/18/2019   CREATININE 0.76 09/18/2019   BUN 11 09/18/2019   CO2 25 09/18/2019   ENDOSCOPIC FINDING: Findings: A large, fungating, submucosal and ulcerating mass with bleeding and no stigmata of recent bleeding was found in the mid esophagus, 26 cm from the incisors. The mass was partially obstructing and circumferential. ENDOSONOGRAPHIC FINDING: A hypoechoic mass was found in the middle third of the esophagus. The mass was encountered at 26 cm from the incisors. The lesion was circumferential.  The endosonographic borders were irregular. There was sonographic evidence suggesting invasion into the muscularis propria (Layer 4). Two abnormal lymph nodes were visualized in the middle paraesophageal mediastinum (level 47M). The nodes were hypoechoic. - Partially obstructing, malignant  esophageal tumor was found in the mid esophagus. Could not traverse tumor stricture with either EGD scope or EUS scope. - A mass was found in the middle third of the esophagus. A tissue diagnosis was obtained prior to this exam. This is of squamous cell carcinoma. This was staged at least T3 N1 Mx by endosonographic criteria; unable to complete staging exam since I could not traverse tumor with the EUS scope. - Two abnormal lymph nodes were visualized in the middle paraesophageal mediastinum (level 47M). To biopsy would require traversing tumor in the esophagus, thus biopsies not performed.   Assessment / Plan:  #1 esophageal squamous cell carcinoma probable clinical stage III though the EUS could not be completely done-radiographically there is no evidence of distant metastatic disease-I discussed with the patient and his wife the diagnosis reviewed the scans and other studies with them.  Does not have significant comorbidities that would prevent consideration of surgical resection following the completion of radiation and chemotherapy.  With the location in the mid third of the esophagus I explained to the patient that approached through the right chest with likely be necessary-deciding on anastomosis in the neck chest to be determined  Patient was given printed information concerning esophageal cancer and surgical resection   Plan to see him back in mid-September about the time he completes his radiation and chemotherapy    Cancer Staging Cancer of middle third of esophagus ([place]) Staging form: Esophagus - Squamous Cell Carcinoma, AJCC 8th Edition - Clinical stage from 09/18/2019: Stage III (cT3, cN1, cM0, GX, L: Middle) - Signed by Grace Isaac, MD on 09/18/2019     Grace Isaac MD      [address] [place] [address] Office [phone]     09/18/2019 5:22 PM

## 2019-09-17 NOTE — Telephone Encounter (Signed)
Wife called to inquire if OK to alternate motrin/tylenol for esophageal pain? Tends to get worse at night. Also asking for something stronger to have for the future, since she knows it will get worse. Trying to elevate head of bed at night-having reflux as well. Also asking about constipation management. Informed wife that alternating motrin and tylenol is fine--will ask Dr. Lisbeth Renshaw to order stronger pain med. OK to start MiraLax 17 grams daily for constipation. Call if this is not adequate.

## 2019-09-18 ENCOUNTER — Inpatient Hospital Stay: Payer: PPO

## 2019-09-18 ENCOUNTER — Other Ambulatory Visit: Payer: Self-pay

## 2019-09-18 ENCOUNTER — Institutional Professional Consult (permissible substitution): Payer: PPO | Admitting: Cardiothoracic Surgery

## 2019-09-18 ENCOUNTER — Encounter: Payer: Self-pay | Admitting: Nurse Practitioner

## 2019-09-18 ENCOUNTER — Other Ambulatory Visit: Payer: PPO

## 2019-09-18 ENCOUNTER — Inpatient Hospital Stay: Payer: PPO | Admitting: Nurse Practitioner

## 2019-09-18 ENCOUNTER — Telehealth: Payer: Self-pay | Admitting: Nurse Practitioner

## 2019-09-18 ENCOUNTER — Encounter: Payer: Self-pay | Admitting: Cardiothoracic Surgery

## 2019-09-18 ENCOUNTER — Ambulatory Visit
Admission: RE | Admit: 2019-09-18 | Discharge: 2019-09-18 | Disposition: A | Discharge status: Home / Self Care | Payer: PPO | Source: Ambulatory Visit | Attending: Radiation Oncology | Admitting: Radiation Oncology

## 2019-09-18 VITALS — BP 128/86 | HR 90 | Temp 97.7°F | Resp 16 | Ht 70.0 in | Wt 160.0 lb

## 2019-09-18 VITALS — BP 123/73 | HR 80 | Temp 96.3°F | Resp 18 | Ht 70.0 in | Wt 160.0 lb

## 2019-09-18 DIAGNOSIS — Z5111 Encounter for antineoplastic chemotherapy: Secondary | ICD-10-CM | POA: Diagnosis not present

## 2019-09-18 DIAGNOSIS — C154 Malignant neoplasm of middle third of esophagus: Secondary | ICD-10-CM

## 2019-09-18 DIAGNOSIS — Z510 Encounter for antineoplastic radiation therapy: Secondary | ICD-10-CM | POA: Diagnosis not present

## 2019-09-18 LAB — CBC WITH DIFFERENTIAL (CANCER CENTER ONLY)
Abs Immature Granulocytes: 0.04 10*3/uL (ref 0.00–0.07)
Basophils Absolute: 0 10*3/uL (ref 0.0–0.1)
Basophils Relative: 0 %
Eosinophils Absolute: 0.1 10*3/uL (ref 0.0–0.5)
Eosinophils Relative: 1 %
HCT: 38.3 % — ABNORMAL LOW (ref 39.0–52.0)
Hemoglobin: 13.4 g/dL (ref 13.0–17.0)
Immature Granulocytes: 1 %
Lymphocytes Relative: 16 %
Lymphs Abs: 0.8 10*3/uL (ref 0.7–4.0)
MCH: 33.3 pg (ref 26.0–34.0)
MCHC: 35 g/dL (ref 30.0–36.0)
MCV: 95.3 fL (ref 80.0–100.0)
Monocytes Absolute: 0.2 10*3/uL (ref 0.1–1.0)
Monocytes Relative: 5 %
Neutro Abs: 3.9 10*3/uL (ref 1.7–7.7)
Neutrophils Relative %: 77 %
Platelet Count: 199 10*3/uL (ref 150–400)
RBC: 4.02 MIL/uL — ABNORMAL LOW (ref 4.22–5.81)
RDW: 11.5 % (ref 11.5–15.5)
WBC Count: 5.1 10*3/uL (ref 4.0–10.5)
nRBC: 0 % (ref 0.0–0.2)

## 2019-09-18 LAB — CMP (CANCER CENTER ONLY)
ALT: 11 U/L (ref 0–44)
AST: 12 U/L — ABNORMAL LOW (ref 15–41)
Albumin: 3.2 g/dL — ABNORMAL LOW (ref 3.5–5.0)
Alkaline Phosphatase: 69 U/L (ref 38–126)
Anion gap: 8 (ref 5–15)
BUN: 11 mg/dL (ref 8–23)
CO2: 25 mmol/L (ref 22–32)
Calcium: 9.8 mg/dL (ref 8.9–10.3)
Chloride: 99 mmol/L (ref 98–111)
Creatinine: 0.76 mg/dL (ref 0.61–1.24)
GFR, Est AFR Am: >60 mL/min (ref >60)
GFR, Estimated: >60 mL/min (ref >60)
Glucose, Bld: 114 mg/dL — ABNORMAL HIGH (ref 70–99)
Potassium: 4.2 mmol/L (ref 3.5–5.1)
Sodium: 132 mmol/L — ABNORMAL LOW (ref 135–145)
Total Bilirubin: 0.8 mg/dL (ref 0.3–1.2)
Total Protein: 6.9 g/dL (ref 6.5–8.1)

## 2019-09-18 MED ORDER — SODIUM CHLORIDE 0.9 % IV SOLN
Freq: Once | INTRAVENOUS | Status: AC
  Filled 2019-09-18: qty 250

## 2019-09-18 MED ORDER — PALONOSETRON HCL INJECTION 0.25 MG/5ML
0.2500 mg | Freq: Once | INTRAVENOUS | Status: AC
  Administered 2019-09-18: 0.25 mg via INTRAVENOUS

## 2019-09-18 MED ORDER — SODIUM CHLORIDE 0.9 % IV SOLN
196.4000 mg | Freq: Once | INTRAVENOUS | Status: AC
  Administered 2019-09-18: 200 mg via INTRAVENOUS
  Filled 2019-09-18: qty 20

## 2019-09-18 MED ORDER — FAMOTIDINE IN NACL 20-0.9 MG/50ML-% IV SOLN
INTRAVENOUS | Status: AC
  Filled 2019-09-18: qty 50

## 2019-09-18 MED ORDER — SODIUM CHLORIDE 0.9 % IV SOLN
10.0000 mg | Freq: Once | INTRAVENOUS | Status: AC
  Administered 2019-09-18: 10 mg via INTRAVENOUS
  Filled 2019-09-18: qty 10

## 2019-09-18 MED ORDER — DIPHENHYDRAMINE HCL 50 MG/ML IJ SOLN
INTRAMUSCULAR | Status: AC
  Filled 2019-09-18: qty 1

## 2019-09-18 MED ORDER — SODIUM CHLORIDE 0.9 % IV SOLN
50.0000 mg/m2 | Freq: Once | INTRAVENOUS | Status: AC
  Administered 2019-09-18: 96 mg via INTRAVENOUS
  Filled 2019-09-18: qty 16

## 2019-09-18 MED ORDER — DIPHENHYDRAMINE HCL 50 MG/ML IJ SOLN
25.0000 mg | Freq: Once | INTRAMUSCULAR | Status: AC
  Administered 2019-09-18: 25 mg via INTRAVENOUS

## 2019-09-18 MED ORDER — FAMOTIDINE IN NACL 20-0.9 MG/50ML-% IV SOLN
20.0000 mg | Freq: Once | INTRAVENOUS | Status: AC
  Administered 2019-09-18: 20 mg via INTRAVENOUS

## 2019-09-18 MED ORDER — PALONOSETRON HCL INJECTION 0.25 MG/5ML
INTRAVENOUS | Status: AC
  Filled 2019-09-18: qty 5

## 2019-09-18 NOTE — Patient Instructions (Signed)
[facility] Discharge Instructions for Patients Receiving Chemotherapy  Today you received the following chemotherapy agents: Paclitaxel (Taxol) and Carboplatin.  To help prevent nausea and vomiting after your treatment, we encourage you to take your nausea medication as directed by your MD.   If you develop nausea and vomiting that is not controlled by your nausea medication, call the clinic.   BELOW ARE SYMPTOMS THAT SHOULD BE REPORTED IMMEDIATELY:  *FEVER GREATER THAN 100.5 F  *CHILLS WITH OR WITHOUT FEVER  NAUSEA AND VOMITING THAT IS NOT CONTROLLED WITH YOUR NAUSEA MEDICATION  *UNUSUAL SHORTNESS OF BREATH  *UNUSUAL BRUISING OR BLEEDING  TENDERNESS IN MOUTH AND THROAT WITH OR WITHOUT PRESENCE OF ULCERS  *URINARY PROBLEMS  *BOWEL PROBLEMS  UNUSUAL RASH Items with * indicate a potential emergency and should be followed up as soon as possible.  Feel free to call the clinic should you have any questions or concerns. The clinic phone number is [phone].  Please show the CHEMO ALERT CARD at check-in to the Emergency Department and triage nurse.    

## 2019-09-18 NOTE — Telephone Encounter (Signed)
Scheduled per 8/10 los. Messaged RN Brayton Layman to give pt appt calendar.

## 2019-09-19 ENCOUNTER — Other Ambulatory Visit: Payer: Self-pay

## 2019-09-19 ENCOUNTER — Ambulatory Visit
Admission: RE | Admit: 2019-09-19 | Discharge: 2019-09-19 | Disposition: A | Discharge status: Home / Self Care | Payer: PPO | Source: Ambulatory Visit | Attending: Radiation Oncology | Admitting: Radiation Oncology

## 2019-09-19 ENCOUNTER — Ambulatory Visit: Payer: PPO

## 2019-09-19 DIAGNOSIS — C154 Malignant neoplasm of middle third of esophagus: Secondary | ICD-10-CM | POA: Diagnosis not present

## 2019-09-19 DIAGNOSIS — Z510 Encounter for antineoplastic radiation therapy: Secondary | ICD-10-CM | POA: Diagnosis not present

## 2019-09-20 ENCOUNTER — Other Ambulatory Visit: Payer: Self-pay

## 2019-09-20 ENCOUNTER — Ambulatory Visit
Admission: RE | Admit: 2019-09-20 | Discharge: 2019-09-20 | Disposition: A | Discharge status: Home / Self Care | Payer: PPO | Source: Ambulatory Visit | Attending: Radiation Oncology | Admitting: Radiation Oncology

## 2019-09-20 ENCOUNTER — Encounter: Payer: Self-pay | Admitting: Gastroenterology

## 2019-09-20 DIAGNOSIS — Z510 Encounter for antineoplastic radiation therapy: Secondary | ICD-10-CM | POA: Diagnosis not present

## 2019-09-20 DIAGNOSIS — C154 Malignant neoplasm of middle third of esophagus: Secondary | ICD-10-CM | POA: Diagnosis not present

## 2019-09-21 ENCOUNTER — Other Ambulatory Visit: Payer: Self-pay

## 2019-09-21 ENCOUNTER — Ambulatory Visit
Admission: RE | Admit: 2019-09-21 | Discharge: 2019-09-21 | Disposition: A | Discharge status: Home / Self Care | Payer: PPO | Source: Ambulatory Visit | Attending: Radiation Oncology | Admitting: Radiation Oncology

## 2019-09-21 DIAGNOSIS — Z510 Encounter for antineoplastic radiation therapy: Secondary | ICD-10-CM | POA: Diagnosis not present

## 2019-09-21 DIAGNOSIS — C154 Malignant neoplasm of middle third of esophagus: Secondary | ICD-10-CM | POA: Diagnosis not present

## 2019-09-22 ENCOUNTER — Other Ambulatory Visit: Payer: Self-pay | Admitting: Oncology

## 2019-09-24 ENCOUNTER — Other Ambulatory Visit: Payer: Self-pay

## 2019-09-24 ENCOUNTER — Ambulatory Visit
Admission: RE | Admit: 2019-09-24 | Discharge: 2019-09-24 | Disposition: A | Discharge status: Home / Self Care | Payer: PPO | Source: Ambulatory Visit | Attending: Radiation Oncology | Admitting: Radiation Oncology

## 2019-09-24 DIAGNOSIS — Z510 Encounter for antineoplastic radiation therapy: Secondary | ICD-10-CM | POA: Diagnosis not present

## 2019-09-24 DIAGNOSIS — C154 Malignant neoplasm of middle third of esophagus: Secondary | ICD-10-CM | POA: Diagnosis not present

## 2019-09-25 ENCOUNTER — Ambulatory Visit: Payer: PPO | Admitting: Nurse Practitioner

## 2019-09-25 ENCOUNTER — Other Ambulatory Visit: Payer: Self-pay

## 2019-09-25 ENCOUNTER — Other Ambulatory Visit: Payer: PPO

## 2019-09-25 ENCOUNTER — Inpatient Hospital Stay: Payer: PPO

## 2019-09-25 ENCOUNTER — Ambulatory Visit
Admission: RE | Admit: 2019-09-25 | Discharge: 2019-09-25 | Disposition: A | Discharge status: Home / Self Care | Payer: PPO | Source: Ambulatory Visit | Attending: Radiation Oncology | Admitting: Radiation Oncology

## 2019-09-25 VITALS — BP 153/81 | HR 88 | Temp 97.9°F | Resp 18 | Ht 70.0 in | Wt 161.2 lb

## 2019-09-25 DIAGNOSIS — C154 Malignant neoplasm of middle third of esophagus: Secondary | ICD-10-CM | POA: Diagnosis not present

## 2019-09-25 DIAGNOSIS — Z5111 Encounter for antineoplastic chemotherapy: Secondary | ICD-10-CM | POA: Diagnosis not present

## 2019-09-25 DIAGNOSIS — Z510 Encounter for antineoplastic radiation therapy: Secondary | ICD-10-CM | POA: Diagnosis not present

## 2019-09-25 LAB — CMP (CANCER CENTER ONLY)
ALT: 12 U/L (ref 0–44)
AST: 10 U/L — ABNORMAL LOW (ref 15–41)
Albumin: 3.2 g/dL — ABNORMAL LOW (ref 3.5–5.0)
Alkaline Phosphatase: 66 U/L (ref 38–126)
Anion gap: 9 (ref 5–15)
BUN: 10 mg/dL (ref 8–23)
CO2: 24 mmol/L (ref 22–32)
Calcium: 9.8 mg/dL (ref 8.9–10.3)
Chloride: 100 mmol/L (ref 98–111)
Creatinine: 0.78 mg/dL (ref 0.61–1.24)
GFR, Est AFR Am: >60 mL/min (ref >60)
GFR, Estimated: >60 mL/min (ref >60)
Glucose, Bld: 103 mg/dL — ABNORMAL HIGH (ref 70–99)
Potassium: 4.1 mmol/L (ref 3.5–5.1)
Sodium: 133 mmol/L — ABNORMAL LOW (ref 135–145)
Total Bilirubin: 0.5 mg/dL (ref 0.3–1.2)
Total Protein: 6.8 g/dL (ref 6.5–8.1)

## 2019-09-25 LAB — CBC WITH DIFFERENTIAL (CANCER CENTER ONLY)
Abs Immature Granulocytes: 0.01 10*3/uL (ref 0.00–0.07)
Basophils Absolute: 0 10*3/uL (ref 0.0–0.1)
Basophils Relative: 0 %
Eosinophils Absolute: 0.1 10*3/uL (ref 0.0–0.5)
Eosinophils Relative: 2 %
HCT: 35.7 % — ABNORMAL LOW (ref 39.0–52.0)
Hemoglobin: 12.5 g/dL — ABNORMAL LOW (ref 13.0–17.0)
Immature Granulocytes: 0 %
Lymphocytes Relative: 23 %
Lymphs Abs: 0.8 10*3/uL (ref 0.7–4.0)
MCH: 33.2 pg (ref 26.0–34.0)
MCHC: 35 g/dL (ref 30.0–36.0)
MCV: 94.9 fL (ref 80.0–100.0)
Monocytes Absolute: 0.3 10*3/uL (ref 0.1–1.0)
Monocytes Relative: 8 %
Neutro Abs: 2.3 10*3/uL (ref 1.7–7.7)
Neutrophils Relative %: 67 %
Platelet Count: 201 10*3/uL (ref 150–400)
RBC: 3.76 MIL/uL — ABNORMAL LOW (ref 4.22–5.81)
RDW: 11.6 % (ref 11.5–15.5)
WBC Count: 3.4 10*3/uL — ABNORMAL LOW (ref 4.0–10.5)
nRBC: 0 % (ref 0.0–0.2)

## 2019-09-25 MED ORDER — SODIUM CHLORIDE 0.9 % IV SOLN
50.0000 mg/m2 | Freq: Once | INTRAVENOUS | Status: AC
  Administered 2019-09-25: 96 mg via INTRAVENOUS
  Filled 2019-09-25: qty 16

## 2019-09-25 MED ORDER — SODIUM CHLORIDE 0.9 % IV SOLN
10.0000 mg | Freq: Once | INTRAVENOUS | Status: AC
  Administered 2019-09-25: 10 mg via INTRAVENOUS
  Filled 2019-09-25: qty 10

## 2019-09-25 MED ORDER — SODIUM CHLORIDE 0.9 % IV SOLN
196.4000 mg | Freq: Once | INTRAVENOUS | Status: AC
  Administered 2019-09-25: 200 mg via INTRAVENOUS
  Filled 2019-09-25: qty 20

## 2019-09-25 MED ORDER — DIPHENHYDRAMINE HCL 50 MG/ML IJ SOLN
25.0000 mg | Freq: Once | INTRAMUSCULAR | Status: AC
  Administered 2019-09-25: 25 mg via INTRAVENOUS

## 2019-09-25 MED ORDER — SODIUM CHLORIDE 0.9 % IV SOLN
Freq: Once | INTRAVENOUS | Status: AC
  Filled 2019-09-25: qty 250

## 2019-09-25 MED ORDER — DIPHENHYDRAMINE HCL 50 MG/ML IJ SOLN
INTRAMUSCULAR | Status: AC
  Filled 2019-09-25: qty 1

## 2019-09-25 MED ORDER — FAMOTIDINE IN NACL 20-0.9 MG/50ML-% IV SOLN
INTRAVENOUS | Status: AC
  Filled 2019-09-25: qty 50

## 2019-09-25 MED ORDER — PALONOSETRON HCL INJECTION 0.25 MG/5ML
INTRAVENOUS | Status: AC
  Filled 2019-09-25: qty 5

## 2019-09-25 MED ORDER — FAMOTIDINE IN NACL 20-0.9 MG/50ML-% IV SOLN
20.0000 mg | Freq: Once | INTRAVENOUS | Status: AC
  Administered 2019-09-25: 20 mg via INTRAVENOUS

## 2019-09-25 MED ORDER — PALONOSETRON HCL INJECTION 0.25 MG/5ML
0.2500 mg | Freq: Once | INTRAVENOUS | Status: AC
  Administered 2019-09-25: 0.25 mg via INTRAVENOUS

## 2019-09-25 NOTE — Patient Instructions (Signed)
[facility] Discharge Instructions for Patients Receiving Chemotherapy  Today you received the following chemotherapy agents Taxol, Carboplatin  To help prevent nausea and vomiting after your treatment, we encourage you to take your nausea medication as directed  If you develop nausea and vomiting that is not controlled by your nausea medication, call the clinic.   BELOW ARE SYMPTOMS THAT SHOULD BE REPORTED IMMEDIATELY:  *FEVER GREATER THAN 100.5 F  *CHILLS WITH OR WITHOUT FEVER  NAUSEA AND VOMITING THAT IS NOT CONTROLLED WITH YOUR NAUSEA MEDICATION  *UNUSUAL SHORTNESS OF BREATH  *UNUSUAL BRUISING OR BLEEDING  TENDERNESS IN MOUTH AND THROAT WITH OR WITHOUT PRESENCE OF ULCERS  *URINARY PROBLEMS  *BOWEL PROBLEMS  UNUSUAL RASH Items with * indicate a potential emergency and should be followed up as soon as possible.  Feel free to call the clinic should you have any questions or concerns. The clinic phone number is [phone].  Please show the CHEMO ALERT CARD at check-in to the Emergency Department and triage nurse.   

## 2019-09-26 ENCOUNTER — Ambulatory Visit
Admission: RE | Admit: 2019-09-26 | Discharge: 2019-09-26 | Disposition: A | Discharge status: Home / Self Care | Payer: PPO | Source: Ambulatory Visit | Attending: Radiation Oncology | Admitting: Radiation Oncology

## 2019-09-26 ENCOUNTER — Other Ambulatory Visit: Payer: Self-pay

## 2019-09-26 DIAGNOSIS — C154 Malignant neoplasm of middle third of esophagus: Secondary | ICD-10-CM | POA: Diagnosis not present

## 2019-09-26 DIAGNOSIS — Z510 Encounter for antineoplastic radiation therapy: Secondary | ICD-10-CM | POA: Diagnosis not present

## 2019-09-27 ENCOUNTER — Other Ambulatory Visit: Payer: Self-pay

## 2019-09-27 ENCOUNTER — Ambulatory Visit
Admission: RE | Admit: 2019-09-27 | Discharge: 2019-09-27 | Disposition: A | Discharge status: Home / Self Care | Payer: PPO | Source: Ambulatory Visit | Attending: Radiation Oncology | Admitting: Radiation Oncology

## 2019-09-27 DIAGNOSIS — C154 Malignant neoplasm of middle third of esophagus: Secondary | ICD-10-CM | POA: Diagnosis not present

## 2019-09-27 DIAGNOSIS — Z510 Encounter for antineoplastic radiation therapy: Secondary | ICD-10-CM | POA: Diagnosis not present

## 2019-09-28 ENCOUNTER — Ambulatory Visit
Admission: RE | Admit: 2019-09-28 | Discharge: 2019-09-28 | Disposition: A | Discharge status: Home / Self Care | Payer: PPO | Source: Ambulatory Visit | Attending: Radiation Oncology | Admitting: Radiation Oncology

## 2019-09-28 ENCOUNTER — Other Ambulatory Visit: Payer: Self-pay

## 2019-09-28 DIAGNOSIS — Z510 Encounter for antineoplastic radiation therapy: Secondary | ICD-10-CM | POA: Diagnosis not present

## 2019-09-28 DIAGNOSIS — C154 Malignant neoplasm of middle third of esophagus: Secondary | ICD-10-CM | POA: Diagnosis not present

## 2019-09-29 ENCOUNTER — Other Ambulatory Visit: Payer: Self-pay | Admitting: Oncology

## 2019-09-30 NOTE — Progress Notes (Addendum)
[place]   Telephone:([phone]) [phone] [phone]   Clinic Follow up Note   Patient Care Team: Alroy Dust, L.Marlou Sa, MD as PCP - General (Family Medicine) Karie Mainland, RD as Dietitian (Nutrition) 10/02/2019  CHIEF COMPLAINT: F/u esophagus cancer  CURRENT THERAPY:  Neoadjuvant concurrent chemoradiation with taxol and carboplatin starting 09/12/19  INTERVAL HISTORY: Raymond Hess returns for f/u and treatment as scheduled. He completed week 3 taxol/carbo on 8/17 and is beginning week 4 chemoRT.  He is doing well overall.  Remains able to eat a regular and solid diet as long as he chews well and eats small portions. The first few swallows in the morning are painful then no residual odynophagia. He had a stomachache after eating greasy pizza and had not taken his bedtime Carafate but otherwise no recurrent episodes.  Takes Compazine daily x3 days after chemo, no nausea, vomiting, constipation, diarrhea.  He notes 7-10 days ago he developed an itching rash on his back that he thought was related to radiation which has now spread to his chest, torso, and back and some on his arms.  Radiation recommended hydrocortisone and for medical oncology to evaluate.  Otherwise, he denies mucositis, fever, chills, cough, chest pain, shortness of breath, neuropathy.   MEDICAL HISTORY:  Past Medical History:  Diagnosis Date  . Angio-edema   . Urticaria     SURGICAL HISTORY: Past Surgical History:  Procedure Laterality Date  . CATARACT EXTRACTION, BILATERAL Bilateral   . ESOPHAGOGASTRODUODENOSCOPY (EGD) WITH PROPOFOL N/A 09/11/2019   Procedure: ESOPHAGOGASTRODUODENOSCOPY (EGD) WITH PROPOFOL;  Surgeon: Arta Silence, MD;  Location: [facility];  Service: Endoscopy;  Laterality: N/A;  . EUS N/A 09/11/2019   Procedure: UPPER ENDOSCOPIC ULTRASOUND (EUS) RADIAL;  Surgeon: Arta Silence, MD;  Location: [facility];  Service: Endoscopy;  Laterality: N/A;    I have reviewed the social  history and family history with the patient and they are unchanged from previous note.  ALLERGIES:  is allergic to other.  MEDICATIONS:  Current Outpatient Medications  Medication Sig Dispense Refill  . acetaminophen (TYLENOL) 325 MG tablet Take 650 mg by mouth every 6 (six) hours as needed.    Marland Kitchen esomeprazole (NEXIUM) 40 MG capsule Take 40 mg by mouth daily.     Marland Kitchen ibuprofen (ADVIL) 200 MG tablet Take 200 mg by mouth every 6 (six) hours as needed for mild pain or moderate pain.    Marland Kitchen ibuprofen (ADVIL) 200 MG tablet Take 400 mg by mouth every 6 (six) hours as needed.     . Polyethylene Glycol 3350 (MIRALAX PO) Take 17 g by mouth daily.    . prochlorperazine (COMPAZINE) 10 MG tablet Take 10 mg by mouth every 6 (six) hours as needed.     . sucralfate (CARAFATE) 1 g tablet Crush and mix in 1 oz water and drink 5 min TID before meals and at HS for radiation induced esophagitis 120 tablet 2   No current facility-administered medications for this visit.    PHYSICAL EXAMINATION:  Vitals:   10/02/19 0832  BP: 124/76  Pulse: 74  Resp: 18  Temp: 98.2 F (36.8 C)  SpO2: 98%   Filed Weights   10/02/19 0832  Weight: 160 lb 11.2 oz (72.9 kg)    GENERAL:alert, no distress and comfortable SKIN: Maculopapular rash with scattered pustules to chest, abdomen back and arms. More concentrated in the upper chest and upper back EYES: sclera clear LUNGS:  normal breathing effort NEURO: alert & oriented x 3 with fluent  speech, normal gait     LABORATORY DATA:  I have reviewed the data as listed CBC Latest Ref Rng & Units 10/02/2019 09/25/2019 09/18/2019  WBC 4.0 - 10.5 K/uL 1.9(L) 3.4(L) 5.1  Hemoglobin 13.0 - 17.0 g/dL 12.7(L) 12.5(L) 13.4  Hematocrit 39 - 52 % 35.7(L) 35.7(L) 38.3(L)  Platelets 150 - 400 K/uL 176 201 199     CMP Latest Ref Rng & Units 10/02/2019 09/25/2019 09/18/2019  Glucose 70 - 99 mg/dL 122(H) 103(H) 114(H)  BUN 8 - 23 mg/dL 10 10 11   Creatinine 0.61 - 1.24 mg/dL 0.73 0.78  0.76  Sodium 135 - 145 mmol/L 134(L) 133(L) 132(L)  Potassium 3.5 - 5.1 mmol/L 4.4 4.1 4.2  Chloride 98 - 111 mmol/L 103 100 99  CO2 22 - 32 mmol/L 23 24 25   Calcium 8.9 - 10.3 mg/dL 9.6 9.8 9.8  Total Protein 6.5 - 8.1 g/dL 6.4(L) 6.8 6.9  Total Bilirubin 0.3 - 1.2 mg/dL 0.4 0.5 0.8  Alkaline Phos 38 - 126 U/L 63 66 69  AST 15 - 41 U/L 11(L) 10(L) 12(L)  ALT 0 - 44 U/L 11 12 11       RADIOGRAPHIC STUDIES: I have personally reviewed the radiological images as listed and agreed with the findings in the report. No results found.   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 Upper endoscopy 08/21/2019-nonobstructing mass in the mid esophagus, biopsy confirmed invasive squamous cell carcinoma, CPS 20 ? CTs 08/27/2019-elongated circumferential thickening of the midesophagus consistent with a primary esophageal malignancy, 8 cm right paraesophageal node suspicious for metastasis, very fine centrilobular pulmonary nodules felt most likely benign ? PET scan 07/15/3708-GYIRSWNIO hypermetabolic mid esophagus mass, mildly hypermetabolic high right paratracheal paraesophageal node no other evidence of metastatic disease ? EUS 09/11/2019-fungating mass in the mid esophagus at 26 cm, partially obstructing, tumor invaded the muscularis propria, 2 abnormal lymph nodes were visualized in the middle paraesophageal mediastinum, mass could not be traversed, at leastuT3uN1, lymph nodes or level 50M-not biopsied secondary to requirement to traverse tumor ? Began neoadjuvant concurrent chemoradiation with taxol 50 mg/m2 and carboplatin AUC 2 on 09/12/2019  ? Day 1 taxol/carbo 09/12/19 ? Day 8 taxol/carbo 09/18/19  ? Day 15 taxol/carbo 09/25/19 2.Dysphagia/odynophagia secondary to #1 3.Alphagalmeat allergy 4.History of squamous cell skin cancer 5. Maculopapular and pustular rash concentrated to upper chest and back, scattered throughout abdomen, low back, arms - likely related to  radiation field, heat, and steroids   Disposition:  Raymond Hess appears stable. He completed 3 weeks of concurrent chemoRT with Taxol and Carboplatin. He is tolerating treatment well overall. He remains able to eat a normal diet without significant odynophagia. Symptoms managed with carafate before meals. Weight is stable.   He has developed a maculopapular and pustular rash to his chest, abdomen, back, and arms which is more concentrated in the radiation field. This is likely related to RT, heat exposure, and steroids. He will continue topical steroids for itching. The suspicion for taxol-related rash is not very high at this point.   He has developed mild neutropenia, ANC 1.3. CBC and CMP otherwise stable. He will proceed with day 28 taxol/carboplatin today as planned. He will return for final chemo next week. We will see him towards the end of radiation to discuss the next phase of his treatment plan. We will keep the infusion scheduled on 9/7 in the event he has progressive neutropenia next week and we have to delay the final cycle.  He is requesting a surgical second opinon, will refer him to Dr. Otilio Connors at [facility].   The patient was seen with Dr. Benay Spice.   No problem-specific Assessment & Plan notes found for this encounter.   Orders Placed This Encounter  Procedures  . Magnesium    Standing Status:   Future    Standing Expiration Date:   10/01/2020  . CBC with Differential (Cancer Center Only)    Standing Status:   Future    Standing Expiration Date:   10/01/2020  . CMP ([facility] only)    Standing Status:   Future    Standing Expiration Date:   10/01/2020  . Ambulatory referral to Cardiothoracic Surgery    Referral Priority:   Routine    Referral Type:   Surgical    Referral Reason:   Specialty Services Required    Referred to Provider:   Lerry Paterson, MD    Number of Visits Requested:   1   All questions were answered. The patient knows to call the clinic with any  problems, questions or concerns. No barriers to learning were detected.     Alla Feeling, NP 10/02/19  This was a shared visit with Cira Rue.  Raymond Hess was interviewed and examined.  He appears to be tolerating the chemotherapy and radiation well.  He has mild neutropenia.  The plan is to proceed with week 4 chemotherapy today.  The rash over the trunk is is most likely secondary to radiation, steroids, and sweating.  The rash has a pustular component.  I have a low clinical suspicion for a chemotherapy related rash.  We discussed the treatment plan with Raymond Hess and his wife.  He requested a 2nd surgical opinion.  We made a referral to Dr. Elenor Quinones.  Julieanne Manson, MD

## 2019-10-01 ENCOUNTER — Ambulatory Visit
Admission: RE | Admit: 2019-10-01 | Discharge: 2019-10-01 | Disposition: A | Discharge status: Home / Self Care | Payer: PPO | Source: Ambulatory Visit | Attending: Radiation Oncology | Admitting: Radiation Oncology

## 2019-10-01 DIAGNOSIS — Z510 Encounter for antineoplastic radiation therapy: Secondary | ICD-10-CM | POA: Diagnosis not present

## 2019-10-01 DIAGNOSIS — C154 Malignant neoplasm of middle third of esophagus: Secondary | ICD-10-CM | POA: Diagnosis not present

## 2019-10-02 ENCOUNTER — Inpatient Hospital Stay: Payer: PPO | Admitting: Nurse Practitioner

## 2019-10-02 ENCOUNTER — Inpatient Hospital Stay: Payer: PPO

## 2019-10-02 ENCOUNTER — Encounter: Payer: Self-pay | Admitting: *Deleted

## 2019-10-02 ENCOUNTER — Ambulatory Visit
Admission: RE | Admit: 2019-10-02 | Discharge: 2019-10-02 | Disposition: A | Discharge status: Home / Self Care | Payer: PPO | Source: Ambulatory Visit | Attending: Radiation Oncology | Admitting: Radiation Oncology

## 2019-10-02 ENCOUNTER — Encounter: Payer: Self-pay | Admitting: Nurse Practitioner

## 2019-10-02 ENCOUNTER — Other Ambulatory Visit: Payer: PPO

## 2019-10-02 ENCOUNTER — Other Ambulatory Visit: Payer: Self-pay

## 2019-10-02 VITALS — BP 124/76 | HR 74 | Temp 98.2°F | Resp 18 | Ht 70.0 in | Wt 160.7 lb

## 2019-10-02 DIAGNOSIS — Z510 Encounter for antineoplastic radiation therapy: Secondary | ICD-10-CM | POA: Diagnosis not present

## 2019-10-02 DIAGNOSIS — C154 Malignant neoplasm of middle third of esophagus: Secondary | ICD-10-CM

## 2019-10-02 DIAGNOSIS — Z5111 Encounter for antineoplastic chemotherapy: Secondary | ICD-10-CM | POA: Diagnosis not present

## 2019-10-02 LAB — CMP (CANCER CENTER ONLY)
ALT: 11 U/L (ref 0–44)
AST: 11 U/L — ABNORMAL LOW (ref 15–41)
Albumin: 3 g/dL — ABNORMAL LOW (ref 3.5–5.0)
Alkaline Phosphatase: 63 U/L (ref 38–126)
Anion gap: 8 (ref 5–15)
BUN: 10 mg/dL (ref 8–23)
CO2: 23 mmol/L (ref 22–32)
Calcium: 9.6 mg/dL (ref 8.9–10.3)
Chloride: 103 mmol/L (ref 98–111)
Creatinine: 0.73 mg/dL (ref 0.61–1.24)
GFR, Est AFR Am: >60 mL/min (ref >60)
GFR, Estimated: >60 mL/min (ref >60)
Glucose, Bld: 122 mg/dL — ABNORMAL HIGH (ref 70–99)
Potassium: 4.4 mmol/L (ref 3.5–5.1)
Sodium: 134 mmol/L — ABNORMAL LOW (ref 135–145)
Total Bilirubin: 0.4 mg/dL (ref 0.3–1.2)
Total Protein: 6.4 g/dL — ABNORMAL LOW (ref 6.5–8.1)

## 2019-10-02 LAB — CBC WITH DIFFERENTIAL (CANCER CENTER ONLY)
Abs Immature Granulocytes: 0.01 10*3/uL (ref 0.00–0.07)
Basophils Absolute: 0 10*3/uL (ref 0.0–0.1)
Basophils Relative: 1 %
Eosinophils Absolute: 0 10*3/uL (ref 0.0–0.5)
Eosinophils Relative: 2 %
HCT: 35.7 % — ABNORMAL LOW (ref 39.0–52.0)
Hemoglobin: 12.7 g/dL — ABNORMAL LOW (ref 13.0–17.0)
Immature Granulocytes: 1 %
Lymphocytes Relative: 18 %
Lymphs Abs: 0.3 10*3/uL — ABNORMAL LOW (ref 0.7–4.0)
MCH: 33.2 pg (ref 26.0–34.0)
MCHC: 35.6 g/dL (ref 30.0–36.0)
MCV: 93.5 fL (ref 80.0–100.0)
Monocytes Absolute: 0.2 10*3/uL (ref 0.1–1.0)
Monocytes Relative: 13 %
Neutro Abs: 1.3 10*3/uL — ABNORMAL LOW (ref 1.7–7.7)
Neutrophils Relative %: 65 %
Platelet Count: 176 10*3/uL (ref 150–400)
RBC: 3.82 MIL/uL — ABNORMAL LOW (ref 4.22–5.81)
RDW: 11.7 % (ref 11.5–15.5)
WBC Count: 1.9 10*3/uL — ABNORMAL LOW (ref 4.0–10.5)
nRBC: 0 % (ref 0.0–0.2)

## 2019-10-02 MED ORDER — SODIUM CHLORIDE 0.9 % IV SOLN
50.0000 mg/m2 | Freq: Once | INTRAVENOUS | Status: AC
  Administered 2019-10-02: 96 mg via INTRAVENOUS
  Filled 2019-10-02: qty 16

## 2019-10-02 MED ORDER — PALONOSETRON HCL INJECTION 0.25 MG/5ML
0.2500 mg | Freq: Once | INTRAVENOUS | Status: AC
  Administered 2019-10-02: 0.25 mg via INTRAVENOUS

## 2019-10-02 MED ORDER — PALONOSETRON HCL INJECTION 0.25 MG/5ML
INTRAVENOUS | Status: AC
  Filled 2019-10-02: qty 5

## 2019-10-02 MED ORDER — SODIUM CHLORIDE 0.9 % IV SOLN
10.0000 mg | Freq: Once | INTRAVENOUS | Status: AC
  Administered 2019-10-02: 10 mg via INTRAVENOUS
  Filled 2019-10-02: qty 10

## 2019-10-02 MED ORDER — SODIUM CHLORIDE 0.9 % IV SOLN
196.4000 mg | Freq: Once | INTRAVENOUS | Status: AC
  Administered 2019-10-02: 200 mg via INTRAVENOUS
  Filled 2019-10-02: qty 20

## 2019-10-02 MED ORDER — DIPHENHYDRAMINE HCL 50 MG/ML IJ SOLN
25.0000 mg | Freq: Once | INTRAMUSCULAR | Status: AC
  Administered 2019-10-02: 25 mg via INTRAVENOUS

## 2019-10-02 MED ORDER — SODIUM CHLORIDE 0.9 % IV SOLN
Freq: Once | INTRAVENOUS | Status: AC
  Filled 2019-10-02: qty 250

## 2019-10-02 MED ORDER — FAMOTIDINE IN NACL 20-0.9 MG/50ML-% IV SOLN
INTRAVENOUS | Status: AC
  Filled 2019-10-02: qty 50

## 2019-10-02 MED ORDER — FAMOTIDINE IN NACL 20-0.9 MG/50ML-% IV SOLN
20.0000 mg | Freq: Once | INTRAVENOUS | Status: AC
  Administered 2019-10-02: 20 mg via INTRAVENOUS

## 2019-10-02 MED ORDER — DIPHENHYDRAMINE HCL 50 MG/ML IJ SOLN
INTRAMUSCULAR | Status: AC
  Filled 2019-10-02: qty 1

## 2019-10-02 NOTE — Progress Notes (Signed)
Nutrition Follow-up:   Patient with esophageal cancer, followed by Dr Benay Spice.  Patient is receiving concurrent chemotherapy and radiation therapy.    Met with patient during infusion.  Patient reports good appetite and able to tolerate all consistencies of foods.  Reports that he has been drinking 1 ensure plus or enlive per day.  Able to eat chicken, tuna if he chews it well.     Medications: carafate  Labs: reviewed  Anthropometrics:   Weight is 160 lb 11.2 oz today   160-162 lb overall   NUTRITION DIAGNOSIS: Food and nutrition related knowledge deficits improving   INTERVENTION:  Reviewed importance of high calorie, high protein foods consumption and weight maintenance during treatment. Encouraged ensure plus/enlive 1-2 times per day.       MONITORING, EVALUATION, GOAL: weight trends, intake   NEXT VISIT: August 31 during treatment  Raymond Hess B. Zenia Resides, [place], [place] Registered Dietitian [phone] (mobile)

## 2019-10-02 NOTE — Patient Instructions (Signed)
   [facility] Discharge Instructions for Patients Receiving Chemotherapy  Today you received the following chemotherapy agents Taxol and Carboplatin   To help prevent nausea and vomiting after your treatment, we encourage you to take your nausea medication as directed.    If you develop nausea and vomiting that is not controlled by your nausea medication, call the clinic.   BELOW ARE SYMPTOMS THAT SHOULD BE REPORTED IMMEDIATELY:  *FEVER GREATER THAN 100.5 F  *CHILLS WITH OR WITHOUT FEVER  NAUSEA AND VOMITING THAT IS NOT CONTROLLED WITH YOUR NAUSEA MEDICATION  *UNUSUAL SHORTNESS OF BREATH  *UNUSUAL BRUISING OR BLEEDING  TENDERNESS IN MOUTH AND THROAT WITH OR WITHOUT PRESENCE OF ULCERS  *URINARY PROBLEMS  *BOWEL PROBLEMS  UNUSUAL RASH Items with * indicate a potential emergency and should be followed up as soon as possible.  Feel free to call the clinic should you have any questions or concerns. The clinic phone number is [phone].  Please show the CHEMO ALERT CARD at check-in to the Emergency Department and triage nurse.   

## 2019-10-02 NOTE — Progress Notes (Signed)
Pt here for MD visit does complain of pin point red rash to back small scabbed area this nurse makes Np aware

## 2019-10-02 NOTE — Progress Notes (Signed)
Faxed referral w/demographics and chart information to Dr. Elenor Quinones at [phone]. Notified radiology to push over last CT and PET to [facility].

## 2019-10-02 NOTE — Progress Notes (Signed)
Ok to treat with ANC 1.3 per Carmelina Dane, NP

## 2019-10-03 ENCOUNTER — Ambulatory Visit
Admission: RE | Admit: 2019-10-03 | Discharge: 2019-10-03 | Disposition: A | Discharge status: Home / Self Care | Payer: PPO | Source: Ambulatory Visit | Attending: Radiation Oncology | Admitting: Radiation Oncology

## 2019-10-03 ENCOUNTER — Other Ambulatory Visit: Payer: Self-pay

## 2019-10-03 DIAGNOSIS — Z510 Encounter for antineoplastic radiation therapy: Secondary | ICD-10-CM | POA: Diagnosis not present

## 2019-10-03 DIAGNOSIS — C154 Malignant neoplasm of middle third of esophagus: Secondary | ICD-10-CM | POA: Diagnosis not present

## 2019-10-04 ENCOUNTER — Ambulatory Visit
Admission: RE | Admit: 2019-10-04 | Discharge: 2019-10-04 | Disposition: A | Discharge status: Home / Self Care | Payer: PPO | Source: Ambulatory Visit | Attending: Radiation Oncology | Admitting: Radiation Oncology

## 2019-10-04 ENCOUNTER — Other Ambulatory Visit: Payer: Self-pay

## 2019-10-04 DIAGNOSIS — Z510 Encounter for antineoplastic radiation therapy: Secondary | ICD-10-CM | POA: Diagnosis not present

## 2019-10-04 DIAGNOSIS — C154 Malignant neoplasm of middle third of esophagus: Secondary | ICD-10-CM | POA: Diagnosis not present

## 2019-10-05 ENCOUNTER — Ambulatory Visit
Admission: RE | Admit: 2019-10-05 | Discharge: 2019-10-05 | Disposition: A | Discharge status: Home / Self Care | Payer: PPO | Source: Ambulatory Visit | Attending: Radiation Oncology | Admitting: Radiation Oncology

## 2019-10-05 ENCOUNTER — Other Ambulatory Visit: Payer: Self-pay

## 2019-10-05 DIAGNOSIS — C154 Malignant neoplasm of middle third of esophagus: Secondary | ICD-10-CM | POA: Diagnosis not present

## 2019-10-05 DIAGNOSIS — Z510 Encounter for antineoplastic radiation therapy: Secondary | ICD-10-CM | POA: Diagnosis not present

## 2019-10-07 ENCOUNTER — Other Ambulatory Visit: Payer: Self-pay | Admitting: Oncology

## 2019-10-08 ENCOUNTER — Ambulatory Visit
Admission: RE | Admit: 2019-10-08 | Discharge: 2019-10-08 | Disposition: A | Discharge status: Home / Self Care | Payer: PPO | Source: Ambulatory Visit | Attending: Radiation Oncology | Admitting: Radiation Oncology

## 2019-10-08 DIAGNOSIS — C154 Malignant neoplasm of middle third of esophagus: Secondary | ICD-10-CM | POA: Diagnosis not present

## 2019-10-08 DIAGNOSIS — Z510 Encounter for antineoplastic radiation therapy: Secondary | ICD-10-CM | POA: Diagnosis not present

## 2019-10-09 ENCOUNTER — Inpatient Hospital Stay: Payer: PPO

## 2019-10-09 ENCOUNTER — Other Ambulatory Visit: Payer: Self-pay

## 2019-10-09 ENCOUNTER — Ambulatory Visit
Admission: RE | Admit: 2019-10-09 | Discharge: 2019-10-09 | Disposition: A | Discharge status: Home / Self Care | Payer: PPO | Source: Ambulatory Visit | Attending: Radiation Oncology | Admitting: Radiation Oncology

## 2019-10-09 VITALS — BP 139/78 | HR 75 | Temp 97.8°F | Resp 18 | Wt 159.5 lb

## 2019-10-09 DIAGNOSIS — Z5111 Encounter for antineoplastic chemotherapy: Secondary | ICD-10-CM | POA: Diagnosis not present

## 2019-10-09 DIAGNOSIS — C154 Malignant neoplasm of middle third of esophagus: Secondary | ICD-10-CM | POA: Diagnosis not present

## 2019-10-09 DIAGNOSIS — Z510 Encounter for antineoplastic radiation therapy: Secondary | ICD-10-CM | POA: Diagnosis not present

## 2019-10-09 LAB — CMP (CANCER CENTER ONLY)
ALT: 12 U/L (ref 0–44)
AST: 13 U/L — ABNORMAL LOW (ref 15–41)
Albumin: 3.3 g/dL — ABNORMAL LOW (ref 3.5–5.0)
Alkaline Phosphatase: 68 U/L (ref 38–126)
Anion gap: 6 (ref 5–15)
BUN: 11 mg/dL (ref 8–23)
CO2: 27 mmol/L (ref 22–32)
Calcium: 10.1 mg/dL (ref 8.9–10.3)
Chloride: 103 mmol/L (ref 98–111)
Creatinine: 0.75 mg/dL (ref 0.61–1.24)
GFR, Est AFR Am: >60 mL/min (ref >60)
GFR, Estimated: >60 mL/min (ref >60)
Glucose, Bld: 84 mg/dL (ref 70–99)
Potassium: 4.2 mmol/L (ref 3.5–5.1)
Sodium: 136 mmol/L (ref 135–145)
Total Bilirubin: 0.4 mg/dL (ref 0.3–1.2)
Total Protein: 6.7 g/dL (ref 6.5–8.1)

## 2019-10-09 LAB — CBC WITH DIFFERENTIAL (CANCER CENTER ONLY)
Abs Immature Granulocytes: 0.01 10*3/uL (ref 0.00–0.07)
Basophils Absolute: 0 10*3/uL (ref 0.0–0.1)
Basophils Relative: 0 %
Eosinophils Absolute: 0 10*3/uL (ref 0.0–0.5)
Eosinophils Relative: 1 %
HCT: 34.8 % — ABNORMAL LOW (ref 39.0–52.0)
Hemoglobin: 12.4 g/dL — ABNORMAL LOW (ref 13.0–17.0)
Immature Granulocytes: 0 %
Lymphocytes Relative: 14 %
Lymphs Abs: 0.3 10*3/uL — ABNORMAL LOW (ref 0.7–4.0)
MCH: 34 pg (ref 26.0–34.0)
MCHC: 35.6 g/dL (ref 30.0–36.0)
MCV: 95.3 fL (ref 80.0–100.0)
Monocytes Absolute: 0.3 10*3/uL (ref 0.1–1.0)
Monocytes Relative: 12 %
Neutro Abs: 1.7 10*3/uL (ref 1.7–7.7)
Neutrophils Relative %: 73 %
Platelet Count: 160 10*3/uL (ref 150–400)
RBC: 3.65 MIL/uL — ABNORMAL LOW (ref 4.22–5.81)
RDW: 12 % (ref 11.5–15.5)
WBC Count: 2.4 10*3/uL — ABNORMAL LOW (ref 4.0–10.5)
nRBC: 0 % (ref 0.0–0.2)

## 2019-10-09 LAB — MAGNESIUM: Magnesium: 1.9 mg/dL (ref 1.7–2.4)

## 2019-10-09 MED ORDER — PALONOSETRON HCL INJECTION 0.25 MG/5ML
0.2500 mg | Freq: Once | INTRAVENOUS | Status: AC
  Administered 2019-10-09: 0.25 mg via INTRAVENOUS

## 2019-10-09 MED ORDER — DIPHENHYDRAMINE HCL 50 MG/ML IJ SOLN
INTRAMUSCULAR | Status: AC
  Filled 2019-10-09: qty 1

## 2019-10-09 MED ORDER — SODIUM CHLORIDE 0.9 % IV SOLN
196.4000 mg | Freq: Once | INTRAVENOUS | Status: AC
  Administered 2019-10-09: 200 mg via INTRAVENOUS
  Filled 2019-10-09: qty 20

## 2019-10-09 MED ORDER — SODIUM CHLORIDE 0.9 % IV SOLN
10.0000 mg | Freq: Once | INTRAVENOUS | Status: AC
  Administered 2019-10-09: 10 mg via INTRAVENOUS
  Filled 2019-10-09: qty 10

## 2019-10-09 MED ORDER — PALONOSETRON HCL INJECTION 0.25 MG/5ML
INTRAVENOUS | Status: AC
  Filled 2019-10-09: qty 5

## 2019-10-09 MED ORDER — SODIUM CHLORIDE 0.9 % IV SOLN
50.0000 mg/m2 | Freq: Once | INTRAVENOUS | Status: AC
  Administered 2019-10-09: 96 mg via INTRAVENOUS
  Filled 2019-10-09: qty 16

## 2019-10-09 MED ORDER — FAMOTIDINE IN NACL 20-0.9 MG/50ML-% IV SOLN
20.0000 mg | Freq: Once | INTRAVENOUS | Status: AC
  Administered 2019-10-09: 20 mg via INTRAVENOUS

## 2019-10-09 MED ORDER — SODIUM CHLORIDE 0.9 % IV SOLN
Freq: Once | INTRAVENOUS | Status: AC
  Filled 2019-10-09: qty 250

## 2019-10-09 MED ORDER — FAMOTIDINE IN NACL 20-0.9 MG/50ML-% IV SOLN
INTRAVENOUS | Status: AC
  Filled 2019-10-09: qty 50

## 2019-10-09 MED ORDER — DIPHENHYDRAMINE HCL 50 MG/ML IJ SOLN
25.0000 mg | Freq: Once | INTRAMUSCULAR | Status: AC
  Administered 2019-10-09: 25 mg via INTRAVENOUS

## 2019-10-09 NOTE — Patient Instructions (Signed)
   [facility] Discharge Instructions for Patients Receiving Chemotherapy  Today you received the following chemotherapy agents Taxol and Carboplatin   To help prevent nausea and vomiting after your treatment, we encourage you to take your nausea medication as directed.    If you develop nausea and vomiting that is not controlled by your nausea medication, call the clinic.   BELOW ARE SYMPTOMS THAT SHOULD BE REPORTED IMMEDIATELY:  *FEVER GREATER THAN 100.5 F  *CHILLS WITH OR WITHOUT FEVER  NAUSEA AND VOMITING THAT IS NOT CONTROLLED WITH YOUR NAUSEA MEDICATION  *UNUSUAL SHORTNESS OF BREATH  *UNUSUAL BRUISING OR BLEEDING  TENDERNESS IN MOUTH AND THROAT WITH OR WITHOUT PRESENCE OF ULCERS  *URINARY PROBLEMS  *BOWEL PROBLEMS  UNUSUAL RASH Items with * indicate a potential emergency and should be followed up as soon as possible.  Feel free to call the clinic should you have any questions or concerns. The clinic phone number is [phone].  Please show the CHEMO ALERT CARD at check-in to the Emergency Department and triage nurse.   

## 2019-10-09 NOTE — Progress Notes (Signed)
Nutrition Follow-up:  Patient with esophageal cancer, followed by Dr. Benay Spice.  Patient is receiving concurrent chemotherapy and radiation.    Met with patient today in infusion.  Patient eating [place] pinwheel sandwich.  Reports that appetite continues to be good and eating anything he wants.  Drinking 1 ensure plus daily.    Noted referral sent to [facility], surgery.     Medications: reviewed  Labs: reviewed  Anthropometrics:   Weight 159 lb 8 oz today slight decrease from 160 lb on 8/24  160-162 lb overall weight    NUTRITION DIAGNOSIS: Food and nutrition knowledge deficits improved   INTERVENTION:  Patient to continue high calorie, high protein foods for weight maintenance Continue ensure plus as tolerated     MONITORING, EVALUATION, GOAL: weight trends, intake   NEXT VISIT: Sept 7 during infusion or phone call  Oneka Parada B. Zenia Resides, [place], [place] Registered Dietitian [phone] (mobile)

## 2019-10-10 ENCOUNTER — Encounter: Payer: Self-pay | Admitting: Nurse Practitioner

## 2019-10-10 ENCOUNTER — Ambulatory Visit
Admission: RE | Admit: 2019-10-10 | Discharge: 2019-10-10 | Disposition: A | Discharge status: Home / Self Care | Payer: PPO | Source: Ambulatory Visit | Attending: Radiation Oncology | Admitting: Radiation Oncology

## 2019-10-10 DIAGNOSIS — C154 Malignant neoplasm of middle third of esophagus: Secondary | ICD-10-CM | POA: Insufficient documentation

## 2019-10-10 DIAGNOSIS — Z510 Encounter for antineoplastic radiation therapy: Secondary | ICD-10-CM | POA: Insufficient documentation

## 2019-10-11 ENCOUNTER — Ambulatory Visit
Admission: RE | Admit: 2019-10-11 | Discharge: 2019-10-11 | Disposition: A | Discharge status: Home / Self Care | Payer: PPO | Source: Ambulatory Visit | Attending: Radiation Oncology | Admitting: Radiation Oncology

## 2019-10-11 DIAGNOSIS — Z510 Encounter for antineoplastic radiation therapy: Secondary | ICD-10-CM | POA: Diagnosis not present

## 2019-10-11 DIAGNOSIS — C154 Malignant neoplasm of middle third of esophagus: Secondary | ICD-10-CM | POA: Diagnosis not present

## 2019-10-12 ENCOUNTER — Ambulatory Visit
Admission: RE | Admit: 2019-10-12 | Discharge: 2019-10-12 | Disposition: A | Discharge status: Home / Self Care | Payer: PPO | Source: Ambulatory Visit | Attending: Radiation Oncology | Admitting: Radiation Oncology

## 2019-10-12 ENCOUNTER — Other Ambulatory Visit: Payer: Self-pay

## 2019-10-12 DIAGNOSIS — Z510 Encounter for antineoplastic radiation therapy: Secondary | ICD-10-CM | POA: Diagnosis not present

## 2019-10-12 DIAGNOSIS — C154 Malignant neoplasm of middle third of esophagus: Secondary | ICD-10-CM | POA: Diagnosis not present

## 2019-10-16 ENCOUNTER — Other Ambulatory Visit: Payer: Self-pay | Admitting: *Deleted

## 2019-10-16 ENCOUNTER — Ambulatory Visit: Payer: PPO

## 2019-10-16 ENCOUNTER — Ambulatory Visit
Admission: RE | Admit: 2019-10-16 | Discharge: 2019-10-16 | Disposition: A | Discharge status: Home / Self Care | Payer: PPO | Source: Ambulatory Visit | Attending: Radiation Oncology | Admitting: Radiation Oncology

## 2019-10-16 ENCOUNTER — Other Ambulatory Visit: Payer: PPO

## 2019-10-16 ENCOUNTER — Telehealth: Payer: Self-pay

## 2019-10-16 ENCOUNTER — Other Ambulatory Visit: Payer: Self-pay

## 2019-10-16 DIAGNOSIS — Z510 Encounter for antineoplastic radiation therapy: Secondary | ICD-10-CM | POA: Diagnosis not present

## 2019-10-16 DIAGNOSIS — C154 Malignant neoplasm of middle third of esophagus: Secondary | ICD-10-CM | POA: Diagnosis not present

## 2019-10-16 MED ORDER — ESOMEPRAZOLE MAGNESIUM 40 MG PO CPDR: 40.0000 mg | DELAYED_RELEASE_CAPSULE | Freq: Every day | ORAL | 2 refills | Status: DC

## 2019-10-16 NOTE — Telephone Encounter (Signed)
Patient requesting refill on  Nexium. OK per Dr. Benay Spice.

## 2019-10-16 NOTE — Telephone Encounter (Signed)
Nutrition Follow-up:   Patient with esophageal cancer, followed by Dr. Benay Spice.  Patient has completed chemotherapy and will completed radiation on 9/13.    Called to follow-up.  Patient in meeting and spoke with wife via phone.  Wife reports that appetite continues to be good.  Reports that he takes carafate before meals and is able to eat any foods that he wants.  Drinks ensure plus 1 time per day.      Medications: reviewed  Labs: reviewed   Anthropometrics:   Weight stable at 158 lb on 9/3 per [facility]  159 lb 8 oz on 8/31 160 lb on 8/24   NUTRITION DIAGNOSIS: Food and nutrition knowledge deficits improved   INTERVENTION:  Encouraged continued high calorie and high protein foods as patient will be preparing for upcoming surgery as well. Stressed importance of good nutrition to wife prior to surgery. Encouraged oral nutrition supplements    MONITORING, EVALUATION, GOAL: weight trends, intake   NEXT VISIT: as needed  Sonu Kruckenberg B. Zenia Resides, [place], [place] Registered Dietitian [phone] (mobile)

## 2019-10-17 ENCOUNTER — Ambulatory Visit: Payer: PPO

## 2019-10-17 ENCOUNTER — Ambulatory Visit
Admission: RE | Admit: 2019-10-17 | Discharge: 2019-10-17 | Disposition: A | Discharge status: Home / Self Care | Payer: PPO | Source: Ambulatory Visit | Attending: Radiation Oncology | Admitting: Radiation Oncology

## 2019-10-17 DIAGNOSIS — C154 Malignant neoplasm of middle third of esophagus: Secondary | ICD-10-CM | POA: Diagnosis not present

## 2019-10-17 DIAGNOSIS — Z510 Encounter for antineoplastic radiation therapy: Secondary | ICD-10-CM | POA: Diagnosis not present

## 2019-10-18 ENCOUNTER — Ambulatory Visit: Payer: PPO

## 2019-10-18 ENCOUNTER — Ambulatory Visit
Admission: RE | Admit: 2019-10-18 | Discharge: 2019-10-18 | Disposition: A | Discharge status: Home / Self Care | Payer: PPO | Source: Ambulatory Visit | Attending: Radiation Oncology | Admitting: Radiation Oncology

## 2019-10-18 ENCOUNTER — Telehealth: Payer: Self-pay | Admitting: Oncology

## 2019-10-18 ENCOUNTER — Other Ambulatory Visit: Payer: Self-pay

## 2019-10-18 ENCOUNTER — Inpatient Hospital Stay: Payer: PPO | Attending: Radiation Oncology | Admitting: Nurse Practitioner

## 2019-10-18 ENCOUNTER — Encounter: Payer: Self-pay | Admitting: Nurse Practitioner

## 2019-10-18 VITALS — BP 141/81 | HR 94 | Temp 98.7°F | Resp 17 | Ht 70.0 in | Wt 159.4 lb

## 2019-10-18 DIAGNOSIS — Z23 Encounter for immunization: Secondary | ICD-10-CM | POA: Insufficient documentation

## 2019-10-18 DIAGNOSIS — Z9221 Personal history of antineoplastic chemotherapy: Secondary | ICD-10-CM | POA: Insufficient documentation

## 2019-10-18 DIAGNOSIS — C154 Malignant neoplasm of middle third of esophagus: Secondary | ICD-10-CM | POA: Diagnosis not present

## 2019-10-18 DIAGNOSIS — Z510 Encounter for antineoplastic radiation therapy: Secondary | ICD-10-CM | POA: Diagnosis not present

## 2019-10-18 DIAGNOSIS — Z923 Personal history of irradiation: Secondary | ICD-10-CM | POA: Diagnosis not present

## 2019-10-18 NOTE — Progress Notes (Addendum)
  [facility] OFFICE PROGRESS NOTE   Diagnosis: Esophagus cancer  INTERVAL HISTORY:   Raymond Hess returns as scheduled.  He completed cycle 5 weekly Taxol/carboplatin 10/09/2019.  He is scheduled to complete the course of radiation 10/22/2019.  He denies nausea/vomiting.  No mouth sores.  No diarrhea.  He denies neuropathy symptoms.  He has no significant dysphagia and overall is tolerating regular diet.  Objective:  Vital signs in last 24 hours:  Blood pressure (!) 141/81, pulse 94, temperature 98.7 F (37.1 C), temperature source Tympanic, resp. rate 17, height 5\' 10"  (1.778 m), weight 159 lb 6.4 oz (72.3 kg), SpO2 100 %.    HEENT: No thrush or ulcers. Resp: Lungs clear bilaterally. Cardio: Regular rate and rhythm. GI: Abdomen soft and nontender.  No hepatomegaly. Vascular: No leg edema. Neuro: Alert and oriented. Skin: Mid upper back with a maculopapular erythematous rash.   Lab Results:  Lab Results  Component Value Date   WBC 2.4 (L) 10/09/2019   HGB 12.4 (L) 10/09/2019   HCT 34.8 (L) 10/09/2019   MCV 95.3 10/09/2019   PLT 160 10/09/2019   NEUTROABS 1.7 10/09/2019    Imaging:  No results found.  Medications: I have reviewed the patient's current medications.  Assessment/Plan: 1. Squamous cell carcinoma of the midesophagus ? Upper endoscopy 08/21/2019-nonobstructing mass in the mid esophagus, biopsy confirmed invasive squamous cell carcinoma, CPS 20 ? CTs 08/27/2019-elongated circumferential thickening of the midesophagus consistent with a primary esophageal malignancy, 8 mm right paraesophageal node suspicious for metastasis, very fine centrilobular pulmonary nodules felt most likely benign ? PET scan 5/91/6384-[id number] hypermetabolic mid esophagus mass, mildly hypermetabolic high right paratracheal paraesophageal node no other evidence of metastatic disease ? EUS 09/11/2019-fungating mass in the mid esophagus at 26 cm, partially obstructing, tumor  invaded the muscularis propria, 2 abnormal lymph nodes were visualized in the middle paraesophageal mediastinum, mass could not be traversed, at leastuT3uN1, lymph nodes or level 87M-not biopsied secondary to requirement to traverse tumor ? Began neoadjuvant concurrent chemoradiation with taxol 50 mg/m2 and carboplatin AUC 2 on 09/12/2019  ? Day 1 taxol/carbo 09/12/19 ? Day 8 taxol/carbo 09/18/19 ? Day 15 taxol/carbo 09/25/19 ? Day 22 Taxol/carboplatin 10/02/2019 ? Day 29 Taxol/carboplatin 10/09/2019 2.Dysphagia/odynophagia secondary to #1 3.Alphagalmeat allergy 4.History of squamous cell skin cancer 5. Maculopapular and pustular rash concentrated to upper chest and back, scattered throughout abdomen, low back, arms - likely related to radiation field, heat, and steroids   Disposition: Raymond Hess appears stable.  He has completed the chemotherapy portion of his treatment.  He is scheduled to complete the course of radiation on 10/22/2019.  He has tolerated treatment well.  He has a follow-up appointment with Dr. Servando Snare 10/25/2019.  He is scheduled to see Dr. Elenor Quinones at [facility] at the end of this month.  We scheduled a return visit here in 4 to 6 weeks.  We discussed the Covid booster.  He has been scheduled to receive this in our office 10/22/2019.  Patient seen with Dr. Benay Spice.  Ned Card ANP/GNP-BC   10/18/2019  1:06 PM This was a shared visit with Ned Card.  Raymond Hess has completed the course of chemotherapy and will finish radiation on 10/22/2019.  He tolerated the treatment well. He is scheduled for surgical follow-up next week.  He will be scheduled for a restaging evaluation within the next 4-6 weeks.  I recommend Dr. Servando Snare direct the restaging evaluation.  Julieanne Manson, MD

## 2019-10-18 NOTE — Telephone Encounter (Signed)
Scheduled appointments per 9/9 los. Patient is aware of appointments and I gave him a calendar print out.

## 2019-10-19 ENCOUNTER — Ambulatory Visit: Payer: PPO

## 2019-10-19 ENCOUNTER — Ambulatory Visit
Admission: RE | Admit: 2019-10-19 | Discharge: 2019-10-19 | Disposition: A | Discharge status: Home / Self Care | Payer: PPO | Source: Ambulatory Visit | Attending: Radiation Oncology | Admitting: Radiation Oncology

## 2019-10-19 DIAGNOSIS — C154 Malignant neoplasm of middle third of esophagus: Secondary | ICD-10-CM | POA: Diagnosis not present

## 2019-10-19 DIAGNOSIS — Z510 Encounter for antineoplastic radiation therapy: Secondary | ICD-10-CM | POA: Diagnosis not present

## 2019-10-22 ENCOUNTER — Encounter: Payer: Self-pay | Admitting: Radiation Oncology

## 2019-10-22 ENCOUNTER — Ambulatory Visit: Payer: PPO

## 2019-10-22 ENCOUNTER — Ambulatory Visit
Admission: RE | Admit: 2019-10-22 | Discharge: 2019-10-22 | Disposition: A | Discharge status: Home / Self Care | Payer: PPO | Source: Ambulatory Visit | Attending: Radiation Oncology | Admitting: Radiation Oncology

## 2019-10-22 ENCOUNTER — Inpatient Hospital Stay: Payer: PPO

## 2019-10-22 ENCOUNTER — Other Ambulatory Visit: Payer: Self-pay

## 2019-10-22 VITALS — BP 134/85 | HR 87 | Temp 98.2°F | Resp 18

## 2019-10-22 DIAGNOSIS — Z510 Encounter for antineoplastic radiation therapy: Secondary | ICD-10-CM | POA: Diagnosis not present

## 2019-10-22 DIAGNOSIS — C154 Malignant neoplasm of middle third of esophagus: Secondary | ICD-10-CM | POA: Diagnosis not present

## 2019-10-22 DIAGNOSIS — Z23 Encounter for immunization: Secondary | ICD-10-CM

## 2019-10-22 NOTE — Patient Instructions (Signed)
   Covid-19 Vaccination Clinic  Name:  Raymond Hess    MRN: [id number] DOB: 11-Nov-1950  10/22/2019  [name] was observed post Covid-19 immunization for 15 minutes without incident. He was provided with Vaccine Information Sheet and instruction to access the V-Safe system.   [name] was instructed to call 911 with any severe reactions post vaccine: Marland Kitchen Difficulty breathing  . Swelling of face and throat  . A fast heartbeat  . A bad rash all over body  . Dizziness and weakness

## 2019-10-23 ENCOUNTER — Ambulatory Visit: Payer: PPO

## 2019-10-24 ENCOUNTER — Ambulatory Visit: Payer: PPO

## 2019-10-25 ENCOUNTER — Other Ambulatory Visit: Payer: Self-pay

## 2019-10-25 ENCOUNTER — Ambulatory Visit: Payer: PPO

## 2019-10-25 ENCOUNTER — Ambulatory Visit: Payer: PPO | Admitting: Cardiothoracic Surgery

## 2019-10-25 VITALS — BP 155/78 | HR 92 | Temp 97.6°F | Resp 20 | Ht 70.0 in | Wt 160.0 lb

## 2019-10-25 DIAGNOSIS — C154 Malignant neoplasm of middle third of esophagus: Secondary | ICD-10-CM

## 2019-10-25 NOTE — Progress Notes (Signed)
[address]       [place] [address]             [phone]                    Raymond Hess [place] Medical Record #[id number] Date of Birth: 02-25-1950  Referring: Ladell Pier, MD Primary Care: Aurea Graff.Marlou Sa, MD Primary Cardiologist: No primary care provider on file.  Chief Complaint:    Chief Complaint  Patient presents with  . Esophageal Cancer    Further discuss surgery     History of Present Illness:    Raymond Hess 69 y.o. male is seen in the office  today for for evaluation of  esophageal squamous cell carcinoma-mid esophagus.    Patient notes that 5 months ago he developed right jaw and upper neck pain, initially was most concerned that he had an abscessed tooth.  After several evaluations there was no evidence of tooth abscess,  His symptoms progressed progressed and he began began having difficulty swallowing solid food.  He also had some epigastric pain.  Because of the symptoms he was referred for upper GI endoscopy-unfortunately this took 2 months to get accomplished but when done demonstrated extensive mid esophageal cancer-biopsy showed squamous cell per records.  Attempted esophageal ultrasound was not completely successful because the scope would not pass through the mass.      [ He completed cycle 5 weekly Taxol/carboplatin 10/09/2019.  He  complete the course of radiation 10/22/2019.  He tolerated chemoradiation well, remained active throughout including doing fairly strenuous outdoor work.  He has maintained his weight close to his usual weight before his illness of 1 63-1 64.   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  He notes alcohol exposure in the past typically 2 beers a day and none recently.  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   Zubrod Score: At the time of surgery this patient's most appropriate activity status/level should be described as: [x]     0    Normal activity, no symptoms []     1    Restricted in physical strenuous activity but ambulatory, able to do out light work []     2    Ambulatory and capable of self care, unable to do work activities, up and about               >50 % of waking hours                              []     3    Only limited self care, in bed greater than 50% of waking hours []     4    Completely disabled, no self care, confined to bed or chair []     5    Moribund   Past Medical History:  Diagnosis Date  . Angio-edema   . Urticaria     Past Surgical History:  Procedure Laterality Date  . CATARACT EXTRACTION, BILATERAL Bilateral   . ESOPHAGOGASTRODUODENOSCOPY (EGD) WITH PROPOFOL N/A 09/11/2019   Procedure: ESOPHAGOGASTRODUODENOSCOPY (EGD) WITH PROPOFOL;  Surgeon:  Arta Silence, MD;  Location: [facility];  Service: Endoscopy;  Laterality: N/A;  . EUS N/A 09/11/2019   Procedure: UPPER ENDOSCOPIC ULTRASOUND (EUS) RADIAL;  Surgeon: Arta Silence, MD;  Location: [facility];  Service: Endoscopy;  Laterality: N/A;    Family history: Patient notes that his father died at age 31 of lung cancer metastatic to the brain, his mother died at age 18 He has 1 sister died at age 53 she had a history of breast cancer and ultimately died of a neuroendocrine tumor, patient has no biologic children   Social History   Tobacco Use  Smoking Status Never Smoker  Smokeless Tobacco Never Used    Social History   Substance and Sexual Activity  Alcohol Use Yes  . Alcohol/week: 2.0 standard drinks  . Types: 1 Glasses of wine, 1 Cans of beer per week     Allergies  Allergen Reactions  . Other Hives    Alphagal (mammal meat)    Current Outpatient  Medications  Medication Sig Dispense Refill  . esomeprazole (NEXIUM) 40 MG capsule Take 1 capsule (40 mg total) by mouth daily. 30 capsule 2  . sucralfate (CARAFATE) 1 g tablet Crush and mix in 1 oz water and drink 5 min TID before meals and at HS for radiation induced esophagitis 120 tablet 2  . ibuprofen (ADVIL) 200 MG tablet Take 400 mg by mouth every 6 (six) hours as needed.     . Polyethylene Glycol 3350 (MIRALAX PO) Take 17 g by mouth daily. (Patient not taking: Reported on 10/25/2019)    . prochlorperazine (COMPAZINE) 10 MG tablet Take 10 mg by mouth every 6 (six) hours as needed.  (Patient not taking: Reported on 10/25/2019)     No current facility-administered medications for this visit.    Pertinent items are noted in HPI.   Review of Systems:     Cardiac Review of Systems: [Y] = yes  or   [ N ] = no   Chest Pain [  N  ]  Resting SOB Aqua.Slicker  ] Exertional SOB  Aqua.Slicker  ]  Orthopnea [N]   Pedal Edema [ N ]    Palpitations Aqua.Slicker ] Syncope  Aqua.Slicker ]   Presyncope Aqua.Slicker  ]   General Review of Systems: [Y] = yes [  ]=no Constitional: recent weight change Blue.Reese  ];  Wt loss over the last 3 months [ 5  ] anorexia [  ]; fatigue Blue.Reese  ]; nausea [  ]; night sweats [  ]; fever [  ]; or chills [  ];           Eye : blurred vision [  ]; diplopia [   ]; vision changes [  ];  Amaurosis fugax[  ]; Resp: cough [  ];  wheezing[  ];  hemoptysis[  ]; shortness of breath[  ]; paroxysmal nocturnal dyspnea[  ]; dyspnea on exertion[  ]; or orthopnea[  ];  GI:  gallstones[  ], vomiting[  ];  dysphagia[y  ]; melena[  ];  hematochezia [  ]; heartburn[ y ];   Hx of  Colonoscopy[ y ]; GU: kidney stones [  ]; hematuria[  ];   dysuria [  ];  nocturia[  ];  history of     obstruction [  ]; urinary frequency [  ]             Skin: rash, swelling[  ];, hair loss[  ];  peripheral edema[  ];  or itching[  ]; Musculosketetal: myalgias[  ];  joint swelling[  ];  joint erythema[  ];  joint pain[  ];  back pain[  ];  Heme/Lymph: bruising[  ];   bleeding[  ];  anemia[  ];  Neuro: TIA[  ];  headaches[  ];  stroke[  ];  vertigo[  ];  seizures[  ];   paresthesias[  ];  difficulty walking[  ];  Psych:depression[  ]; anxiety[  ];  Endocrine: diabetes[  ];  thyroid dysfunction[  ];  Immunizations: Flu up to date Blue.Reese  ]; Pneumococcal up to date [ y ]; COVID [  }  Other:     PHYSICAL EXAMINATION: BP (!) 155/78   Pulse 92   Temp 97.6 F (36.4 C) (Skin)   Resp 20   Ht 5\' 10"  (1.778 m)   Wt 160 lb (72.6 kg)   SpO2 98% Comment: RA  BMI 22.96 kg/m  General appearance: alert, cooperative and no distress Head: Normocephalic, without obvious abnormality, atraumatic Neck: no adenopathy, no carotid bruit, no JVD, supple, symmetrical, trachea midline and thyroid not enlarged, symmetric, no tenderness/mass/nodules Lymph nodes: Cervical, supraclavicular, and axillary nodes normal. Resp: clear to auscultation bilaterally Cardio: regular rate and rhythm, S1, S2 normal, no murmur, click, rub or gallop GI: soft, non-tender; bowel sounds normal; no masses,  no organomegaly Extremities: extremities normal, atraumatic, no cyanosis or edema and Homans sign is negative, no sign of DVT Neurologic: Grossly normal   Diagnostic Studies & Laboratory data:     Recent Radiology Findings:   CT ABDOMEN W CONTRAST/ CT CHEST W CONTRAST  Result Date: 08/27/2019 CLINICAL DATA:  Esophageal mass, initial staging EXAM: CT CHEST AND ABDOMEN WITH CONTRAST TECHNIQUE: Multidetector CT imaging of the chest and abdomen was performed following the standard protocol during bolus administration of intravenous contrast. CONTRAST:  167mL OMNIPAQUE IOHEXOL 300 MG/ML SOLN, additional oral enteric contrast COMPARISON:  None. FINDINGS: CT CHEST FINDINGS Cardiovascular: Left coronary artery calcifications. Normal heart size. No pericardial effusion. Mediastinum/Nodes: There is a subcentimeter superior right paraesophageal lymph node measuring 8 mm (series 2, image 10). No other  enlarged lymph nodes in the chest. Elongated circumferential thickening of the midesophagus, arising at the level of the aortic arch and extending to the left atrium, measuring approximately 8.5 cm in length (series 2, image 24, series 6, image 83). Thyroid gland and trachea demonstrate no significant findings. Lungs/Pleura: Innumerable very fine centrilobular pulmonary nodules, most numerous in the lung apices. No pleural effusion or pneumothorax. Musculoskeletal: No chest wall mass or suspicious bone lesions identified. CT ABDOMEN PELVIS FINDINGS Hepatobiliary: No solid liver abnormality is seen. No gallstones, gallbladder wall thickening, or biliary dilatation. Pancreas: Unremarkable. No pancreatic ductal dilatation or surrounding inflammatory changes. Spleen: Normal in size without significant abnormality. Adrenals/Urinary Tract: Adrenal glands are unremarkable. Kidneys are normal, without renal calculi, solid lesion, or hydronephrosis. Bladder is unremarkable. Stomach/Bowel: Stomach is within normal limits. Appendix appears normal. No evidence of bowel wall thickening, distention, or inflammatory changes. Vascular/Lymphatic: Scattered aortic atherosclerosis no enlarged abdominal lymph nodes. Other: No abdominal wall hernia or abnormality. No abdominopelvic ascites. Musculoskeletal: No acute or significant osseous findings. IMPRESSION: 1. Elongated circumferential thickening of the midesophagus, arising at the level of the aortic arch and extending to the left atrium, measuring approximately 8.5 cm in length, consistent with primary esophageal malignancy. 2. There is a subcentimeter superior right paraesophageal lymph node measuring 8 mm, suspicious for nodal metastatic disease. No other enlarged lymph nodes in the chest.  3. No other evidence of metastatic disease in the chest or abdomen. 4. Innumerable very fine centrilobular pulmonary nodules, most numerous in the lung apices, most commonly seen in  smoking-related respiratory bronchiolitis. 5. Coronary artery disease.  Aortic Atherosclerosis (ICD10-I70.0). Electronically Signed   By: Eddie Candle M.D.   On: 08/27/2019 12:39   NM PET Image Initial (PI) Skull Base To Thigh  Result Date: 08/31/2019 CLINICAL DATA:  Initial treatment strategy for middle third of esophagus mass. EXAM: NUCLEAR MEDICINE PET SKULL BASE TO THIGH TECHNIQUE: 8.7 mCi F-18 FDG was injected intravenously. Full-ring PET imaging was performed from the skull base to thigh after the radiotracer. CT data was obtained and used for attenuation correction and anatomic localization. Fasting blood glucose: 92 mg/dl COMPARISON:  08/27/2019 CT chest and abdomen. FINDINGS: Mediastinal blood pool activity: SUV max 2.8 Liver activity: SUV max NA NECK: No hypermetabolic lymph nodes in the neck. Incidental CT findings: none CHEST: Intensely hypermetabolic annular esophageal mass centered in the middle third of the thoracic esophagus measuring up to 3.8 x 3.0 cm in axial dimensions and 8.7 cm craniocaudal with max SUV 31.1 (series 4/image 76). High right paraesophageal 0.7 cm lymph node demonstrates mild hypermetabolism with max SUV 3.6 (series 4/image 56). No additional hypermetabolic axillary, mediastinal or hilar lymph nodes. No hypermetabolic pulmonary findings. Incidental CT findings: Coronary atherosclerosis. No acute consolidative airspace disease, lung masses or significant pulmonary nodules. Previously described fine centrilobular micronodularity in the upper lobes on the prior chest CT is not appreciated on these low-dose CT images. ABDOMEN/PELVIS: No abnormal hypermetabolic activity within the liver, pancreas, adrenal glands, or spleen. No hypermetabolic lymph nodes in the abdomen or pelvis. Incidental CT findings: Mild left colonic diverticulosis. Mildly atherosclerotic nonaneurysmal abdominal aorta. SKELETON: No focal hypermetabolic activity to suggest skeletal metastasis. Incidental CT  findings: none IMPRESSION: 1. Intensely hypermetabolic (max SUV 31) annular 3.8 x 3.0 x 8.7 cm mass centered in the middle third of the thoracic esophagus compatible with primary esophageal malignancy. 2. Mildly hypermetabolic high right paraesophageal lymph node compatible with nodal metastasis. 3. No additional sites of hypermetabolic metastatic disease. 4. Chronic findings include: Aortic Atherosclerosis (ICD10-I70.0). Coronary atherosclerosis. Mild left colonic diverticulosis. Electronically Signed   By: Ilona Sorrel M.D.   On: 08/31/2019 09:15     I have independently reviewed the above radiology studies  and reviewed the findings with the patient.   Recent Lab Findings: Lab Results  Component Value Date   WBC 2.4 (L) 10/09/2019   HGB 12.4 (L) 10/09/2019   HCT 34.8 (L) 10/09/2019   PLT 160 10/09/2019   GLUCOSE 84 10/09/2019   ALT 12 10/09/2019   AST 13 (L) 10/09/2019   NA 136 10/09/2019   K 4.2 10/09/2019   CL 103 10/09/2019   CREATININE 0.75 10/09/2019   BUN 11 10/09/2019   CO2 27 10/09/2019   ENDOSCOPIC FINDING: Findings: A large, fungating, submucosal and ulcerating mass with bleeding and no stigmata of recent bleeding was found in the mid esophagus, 26 cm from the incisors. The mass was partially obstructing and circumferential. ENDOSONOGRAPHIC FINDING: A hypoechoic mass was found in the middle third of the esophagus. The mass was encountered at 26 cm from the incisors. The lesion was circumferential. The endosonographic borders were irregular. There was sonographic evidence suggesting invasion into the muscularis propria (Layer 4). Two abnormal lymph nodes were visualized in the middle paraesophageal mediastinum (level 28M). The nodes were hypoechoic. - Partially obstructing, malignant esophageal tumor was found in the mid  esophagus. Could not traverse tumor stricture with either EGD scope or EUS scope. - A mass was found in the middle third of the esophagus. A tissue  diagnosis was obtained prior to this exam. This is of squamous cell carcinoma. This was staged at least T3 N1 Mx by endosonographic criteria; unable to complete staging exam since I could not traverse tumor with the EUS scope. - Two abnormal lymph nodes were visualized in the middle paraesophageal mediastinum (level 32M). To biopsy would require traversing tumor in the esophagus, thus biopsies not performed.   Assessment / Plan:  #1 mid esophageal squamous carcinoma clinically stage III-now just completing 5 cycles of chemotherapy-weekly Taxol/carboplatin and completed radiation therapy earlier this week.  I discussed with the patient and his wife considerations for surgical resection.  Most likely with gastric conduit to the cervical esophagus-with resection of the thoracic esophagus robotically.  We will plan restaging CT scan of the chest and abdomen and proximately 1 month with consideration for surgical resection between 6 and 8 weeks following the completion of his radiation.  The patient has a second opinion at [facility] later this month.  We will wait on scheduling any scans until he decides how he would like to proceed.     Cancer Staging Cancer of middle third of esophagus [facility]) Staging form: Esophagus - Squamous Cell Carcinoma, AJCC 8th Edition - Clinical stage from 09/18/2019: Stage III (cT3, cN1, cM0, GX, L: Middle) - Signed by Grace Isaac, MD on 09/18/2019     Grace Isaac MD      [address] [place] [address] Office [phone]     10/25/2019 1:28 PM

## 2019-11-05 ENCOUNTER — Other Ambulatory Visit: Payer: Self-pay

## 2019-11-05 ENCOUNTER — Emergency Department (HOSPITAL_COMMUNITY)
Admission: EM | Admit: 2019-11-05 | Discharge: 2019-11-06 | Disposition: A | Discharge status: Home / Self Care | Payer: PPO | Attending: Emergency Medicine | Admitting: Emergency Medicine

## 2019-11-05 ENCOUNTER — Encounter (HOSPITAL_COMMUNITY): Payer: Self-pay | Admitting: Emergency Medicine

## 2019-11-05 DIAGNOSIS — Z5321 Procedure and treatment not carried out due to patient leaving prior to being seen by health care provider: Secondary | ICD-10-CM | POA: Diagnosis not present

## 2019-11-05 DIAGNOSIS — C140 Malignant neoplasm of pharynx, unspecified: Secondary | ICD-10-CM | POA: Diagnosis not present

## 2019-11-05 DIAGNOSIS — W230XXA Caught, crushed, jammed, or pinched between moving objects, initial encounter: Secondary | ICD-10-CM | POA: Diagnosis not present

## 2019-11-05 DIAGNOSIS — T18120A Food in esophagus causing compression of trachea, initial encounter: Secondary | ICD-10-CM | POA: Insufficient documentation

## 2019-11-05 NOTE — ED Triage Notes (Signed)
Pt is a throat cancer pt who finished treatment two weeks ago.  Tonight around 6:30 he was eating and something got caught in his throat.  Pt does not appear to be in respiratory distress, reports it feels like it is "calming down" but is unable to swallow his salvia.  He is able to speak in complete sentences at this time.

## 2019-11-06 DIAGNOSIS — C154 Malignant neoplasm of middle third of esophagus: Secondary | ICD-10-CM | POA: Diagnosis not present

## 2019-11-06 NOTE — ED Notes (Signed)
Pt's wife requested to speak to RN.  RN answered questions about wait.  Again, pt does not appear to be in respiratory distress and is able to speak in complete sentences.  He still is unable to swallow his saliva.    Wife and pt decided to go home and see primary provider in AM.  RN encouraged pt to return should he begin to have respiratory distress.

## 2019-11-06 NOTE — Progress Notes (Signed)
Patient's wife calls this morning to report that last evening he got "obstructed" while eating [employer] and biscuit, which they know he shouldn't have been eating. Vomited, couldn't even swallow saliva, called after hours nurse and was told to go to ED.  They went to [facility] ED and waited 3 hours without being seen and left. This morning he is not feeling the sternum pain he had last night and was able to take sips of water which he stated felt normal to swallow.  They have an appointment at [facility] this morning for second surgical opinion.  I advised him to stay with sips of water only until seen at [facility] this morning.  I reinforced the need for him to be on mechanical soft/liquid diet and to avoid foods like he had last night.  She verbalized an understanding.

## 2019-11-06 NOTE — Progress Notes (Signed)
°  Radiation Oncology         [phone]) [phone] ________________________________  Name: Raymond Hess MRN: [id number]  Date: 10/22/2019  DOB: 1950-12-11  End of Treatment Note  Diagnosis:   esophageal cancer     Indication for treatment::  curative       Radiation treatment dates:   09/12/19 - 10/22/19  Site/dose:   The patient was treated to the esophagus to a dose of 45 Gy in 25 fractions using an IMRT technique. The patient then received a boost for an additional 5.4 Gy to yield a total dose of 50.4 Gy. An SIB technique was also used to treat the tumor to a dose of 50 Gy/6Gy for a total of 56 Gy total.   Narrative: The patient tolerated radiation treatment and completed the prescribed course.      Plan: The patient has completed radiation treatment. The patient will return to radiation oncology clinic for routine followup in one month. I advised the patient to call or return sooner if they have any questions or concerns related to their recovery or treatment. ________________________________  Jodelle Gross, M.D., Ph.D.

## 2019-11-06 NOTE — Progress Notes (Signed)
Patient's wife Butch Penny calls back with report after seeing Dr. Elenor Quinones at [facility].  They plan to do a bronchoscopy on Monday 10/4 and possible dilation of the esophagus.  They plan to do surgery on 11/30/2019.  They were told thin liquids only until Monday.  On the way home he tried one spoonful of plain yogurt and started regurgitating and having pain again.  I spoke with Dr. Benay Spice who also reiterated thin liquids only.  Also call his GI provider and left him know what has transpired.  I let his wife know and she plans to give them a call.  I instructed her that if he cannot get thin liquids down he must go to the ED right away.  I also told her that if later in the week he feels dehydrated to call early in the morning and we can possibly get him in for IVF.  She verbalized an understanding.

## 2019-11-07 ENCOUNTER — Encounter: Payer: Self-pay | Admitting: Cardiothoracic Surgery

## 2019-11-12 ENCOUNTER — Other Ambulatory Visit: Payer: Self-pay

## 2019-11-12 DIAGNOSIS — Z923 Personal history of irradiation: Secondary | ICD-10-CM | POA: Diagnosis not present

## 2019-11-12 DIAGNOSIS — Z9221 Personal history of antineoplastic chemotherapy: Secondary | ICD-10-CM | POA: Diagnosis not present

## 2019-11-12 DIAGNOSIS — C154 Malignant neoplasm of middle third of esophagus: Secondary | ICD-10-CM | POA: Diagnosis not present

## 2019-11-12 DIAGNOSIS — Z85828 Personal history of other malignant neoplasm of skin: Secondary | ICD-10-CM | POA: Diagnosis not present

## 2019-11-12 DIAGNOSIS — R1310 Dysphagia, unspecified: Secondary | ICD-10-CM | POA: Diagnosis not present

## 2019-11-12 DIAGNOSIS — Z79899 Other long term (current) drug therapy: Secondary | ICD-10-CM | POA: Diagnosis not present

## 2019-11-12 DIAGNOSIS — K222 Esophageal obstruction: Secondary | ICD-10-CM | POA: Diagnosis not present

## 2019-11-12 MED ORDER — SUCRALFATE 1 G PO TABS: ORAL_TABLET | ORAL | 0 refills | Status: DC

## 2019-11-15 NOTE — Progress Notes (Signed)
Patient's wife Butch Penny called needing a letter to postpone his jury duty service.  A letter was drafted and signed by Dr. Benay Spice. I have placed it in an envelope at the front reception desk at [facility] for her to pick up.

## 2019-11-19 ENCOUNTER — Telehealth: Payer: Self-pay | Admitting: Radiation Oncology

## 2019-11-19 NOTE — Telephone Encounter (Signed)
  Radiation Oncology         [phone]) [phone] ________________________________  Name: Raymond Hess MRN: [id number]  Date of Service: 11/19/2019  DOB: [birth year]  Post Treatment Telephone Note  Diagnosis:   Stage III, cT3N1M0, Squamous Cell Carcinoma of the mid esophagus  Interval Since Last Radiation:  4 weeks   09/12/19 - 10/22/19: The patient was treated to the esophagus to a dose of 45 Gy in 25 fractions using an IMRT technique. The patient then received a boost for an additional 5.4 Gy to yield a total dose of 50.4 Gy. An SIB technique was also used to treat the tumor to a dose of 50 Gy/6Gy for a total of 56 Gy total.  Narrative:  The patient was contacted today for routine follow-up. During treatment he did very well with radiotherapy and did not have significant desquamation. 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 Last week he did have EGD and bronchoscopy preoperatively and to relieve symptoms of obstruction. He has met with Dr. Elenor Quinones at [facility] and is scheduled for esophagectomy on 11/30/19.  Impression/Plan: 1. Stage III, cT3N1M0, Squamous Cell Carcinoma of the mid esophagus. The patient has been doing well since completion of radiotherapy per his wife on our call. We discussed that we would be happy to continue to follow him as needed, but he will also continue to follow up with Dr. Benay Spice in medical oncology and at [place] with Dr. Elenor Quinones.    Carola Rhine, [facility]

## 2019-11-27 ENCOUNTER — Ambulatory Visit: Payer: PPO | Admitting: Oncology

## 2019-11-29 DIAGNOSIS — J9811 Atelectasis: Secondary | ICD-10-CM | POA: Diagnosis not present

## 2019-11-29 DIAGNOSIS — K222 Esophageal obstruction: Secondary | ICD-10-CM | POA: Diagnosis not present

## 2019-11-29 DIAGNOSIS — Z91018 Allergy to other foods: Secondary | ICD-10-CM | POA: Diagnosis not present

## 2019-11-29 DIAGNOSIS — E875 Hyperkalemia: Secondary | ICD-10-CM | POA: Diagnosis not present

## 2019-11-29 DIAGNOSIS — C159 Malignant neoplasm of esophagus, unspecified: Secondary | ICD-10-CM | POA: Diagnosis not present

## 2019-11-29 DIAGNOSIS — J90 Pleural effusion, not elsewhere classified: Secondary | ICD-10-CM | POA: Diagnosis not present

## 2019-11-29 DIAGNOSIS — Z9221 Personal history of antineoplastic chemotherapy: Secondary | ICD-10-CM | POA: Diagnosis not present

## 2019-11-29 DIAGNOSIS — L259 Unspecified contact dermatitis, unspecified cause: Secondary | ICD-10-CM | POA: Diagnosis not present

## 2019-11-29 DIAGNOSIS — Z20822 Contact with and (suspected) exposure to covid-19: Secondary | ICD-10-CM | POA: Diagnosis not present

## 2019-11-29 DIAGNOSIS — Z4682 Encounter for fitting and adjustment of non-vascular catheter: Secondary | ICD-10-CM | POA: Diagnosis not present

## 2019-11-29 DIAGNOSIS — C154 Malignant neoplasm of middle third of esophagus: Secondary | ICD-10-CM | POA: Diagnosis not present

## 2019-11-29 DIAGNOSIS — J939 Pneumothorax, unspecified: Secondary | ICD-10-CM | POA: Diagnosis not present

## 2019-11-29 DIAGNOSIS — Z01818 Encounter for other preprocedural examination: Secondary | ICD-10-CM | POA: Diagnosis not present

## 2019-11-29 DIAGNOSIS — E162 Hypoglycemia, unspecified: Secondary | ICD-10-CM | POA: Diagnosis not present

## 2019-11-29 DIAGNOSIS — G8918 Other acute postprocedural pain: Secondary | ICD-10-CM | POA: Diagnosis not present

## 2019-11-29 DIAGNOSIS — K449 Diaphragmatic hernia without obstruction or gangrene: Secondary | ICD-10-CM | POA: Diagnosis not present

## 2019-11-29 DIAGNOSIS — R490 Dysphonia: Secondary | ICD-10-CM | POA: Diagnosis not present

## 2019-11-29 DIAGNOSIS — K219 Gastro-esophageal reflux disease without esophagitis: Secondary | ICD-10-CM | POA: Diagnosis not present

## 2019-11-29 DIAGNOSIS — E871 Hypo-osmolality and hyponatremia: Secondary | ICD-10-CM | POA: Diagnosis not present

## 2019-11-29 DIAGNOSIS — Z923 Personal history of irradiation: Secondary | ICD-10-CM | POA: Diagnosis not present

## 2019-11-29 DIAGNOSIS — R0602 Shortness of breath: Secondary | ICD-10-CM | POA: Diagnosis not present

## 2019-11-29 DIAGNOSIS — E7429 Other disorders of galactose metabolism: Secondary | ICD-10-CM | POA: Diagnosis not present

## 2019-11-29 DIAGNOSIS — Z452 Encounter for adjustment and management of vascular access device: Secondary | ICD-10-CM | POA: Diagnosis not present

## 2019-11-29 DIAGNOSIS — Z79899 Other long term (current) drug therapy: Secondary | ICD-10-CM | POA: Diagnosis not present

## 2019-11-30 DIAGNOSIS — J9811 Atelectasis: Secondary | ICD-10-CM | POA: Diagnosis not present

## 2019-11-30 DIAGNOSIS — C159 Malignant neoplasm of esophagus, unspecified: Secondary | ICD-10-CM | POA: Diagnosis not present

## 2019-11-30 DIAGNOSIS — Z923 Personal history of irradiation: Secondary | ICD-10-CM | POA: Diagnosis not present

## 2019-11-30 DIAGNOSIS — Z4682 Encounter for fitting and adjustment of non-vascular catheter: Secondary | ICD-10-CM | POA: Diagnosis not present

## 2019-11-30 DIAGNOSIS — C154 Malignant neoplasm of middle third of esophagus: Secondary | ICD-10-CM | POA: Diagnosis not present

## 2019-11-30 DIAGNOSIS — Z9221 Personal history of antineoplastic chemotherapy: Secondary | ICD-10-CM | POA: Diagnosis not present

## 2019-11-30 HISTORY — PX: ESOPHAGECTOMY: SUR457

## 2019-12-01 DIAGNOSIS — G8918 Other acute postprocedural pain: Secondary | ICD-10-CM | POA: Diagnosis not present

## 2019-12-02 DIAGNOSIS — G8918 Other acute postprocedural pain: Secondary | ICD-10-CM | POA: Diagnosis not present

## 2019-12-03 DIAGNOSIS — G8918 Other acute postprocedural pain: Secondary | ICD-10-CM | POA: Diagnosis not present

## 2019-12-04 DIAGNOSIS — G8918 Other acute postprocedural pain: Secondary | ICD-10-CM | POA: Diagnosis not present

## 2019-12-05 DIAGNOSIS — G8918 Other acute postprocedural pain: Secondary | ICD-10-CM | POA: Diagnosis not present

## 2019-12-06 DIAGNOSIS — G8918 Other acute postprocedural pain: Secondary | ICD-10-CM | POA: Diagnosis not present

## 2019-12-06 DIAGNOSIS — C159 Malignant neoplasm of esophagus, unspecified: Secondary | ICD-10-CM | POA: Diagnosis not present

## 2019-12-07 DIAGNOSIS — J939 Pneumothorax, unspecified: Secondary | ICD-10-CM | POA: Diagnosis not present

## 2019-12-07 DIAGNOSIS — Z452 Encounter for adjustment and management of vascular access device: Secondary | ICD-10-CM | POA: Diagnosis not present

## 2019-12-07 DIAGNOSIS — J90 Pleural effusion, not elsewhere classified: Secondary | ICD-10-CM | POA: Diagnosis not present

## 2019-12-07 DIAGNOSIS — R490 Dysphonia: Secondary | ICD-10-CM | POA: Diagnosis not present

## 2019-12-10 NOTE — Progress Notes (Signed)
Explained to patient's wife Butch Penny that Dr. Benay Spice is recommending if he has problems with his J-tube that they contact [facility] surgeon's office that place it.  She states she will if she has problems with feeling like the tape is not secure enough.

## 2019-12-10 NOTE — Progress Notes (Signed)
Patient's wife Butch Penny calls to give [place] an update s/p surgery at [facility].  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  It also affected his swallowing.  He has tube feedings and is able to eat nectar consistency foods.  He does have a J-tube and his sutures had been in for 10 days and they were told they needed to come out after 10 days.  Just got home from [place] last night.  A nurse friend of theirs cut the sutures and then the tube came out at least 1 1/2 inches.  They spoke to [facility] and were told they could either come back to [facility] to have sutures put back in, secure the tube with tape to keep it from advancing or reach out to Dr. Benay Spice to see if he recommends having sutures put back in locally.    I told her I would check with Dr. Benay Spice and let her know but she feels very confident she can secure the tube with tape to keep it from falling out.

## 2019-12-11 ENCOUNTER — Telehealth: Payer: Self-pay

## 2019-12-11 ENCOUNTER — Telehealth: Payer: Self-pay | Admitting: Nutrition

## 2019-12-11 DIAGNOSIS — C159 Malignant neoplasm of esophagus, unspecified: Secondary | ICD-10-CM | POA: Diagnosis not present

## 2019-12-11 NOTE — Telephone Encounter (Signed)
Patient's wife calls with change in patient's status, now running 99.1, dry cough, some SOB with exertion, not as active as yesterday.  I showed Dr. Benay Spice the x-ray report from [place] and his recommendation is for patient to go to [facility] ED to be evaluated.  I have informed them and they verbalized an understanding.

## 2019-12-11 NOTE — Telephone Encounter (Signed)
Wife requesting purchasing information on [employer]. I gave her the [place] contact information to order product.

## 2019-12-12 ENCOUNTER — Other Ambulatory Visit: Payer: Self-pay

## 2019-12-12 ENCOUNTER — Inpatient Hospital Stay: Payer: PPO | Attending: Radiation Oncology | Admitting: Oncology

## 2019-12-12 ENCOUNTER — Encounter: Payer: Self-pay | Admitting: *Deleted

## 2019-12-12 ENCOUNTER — Ambulatory Visit (HOSPITAL_COMMUNITY)
Admission: RE | Admit: 2019-12-12 | Discharge: 2019-12-12 | Disposition: A | Discharge status: Home / Self Care | Payer: PPO | Source: Ambulatory Visit | Attending: Oncology | Admitting: Oncology

## 2019-12-12 VITALS — BP 142/77 | HR 55 | Temp 97.5°F | Resp 18 | Ht 70.0 in | Wt 155.5 lb

## 2019-12-12 DIAGNOSIS — J3800 Paralysis of vocal cords and larynx, unspecified: Secondary | ICD-10-CM

## 2019-12-12 DIAGNOSIS — C154 Malignant neoplasm of middle third of esophagus: Secondary | ICD-10-CM | POA: Insufficient documentation

## 2019-12-12 DIAGNOSIS — Z85828 Personal history of other malignant neoplasm of skin: Secondary | ICD-10-CM | POA: Insufficient documentation

## 2019-12-12 DIAGNOSIS — J3801 Paralysis of vocal cords and larynx, unilateral: Secondary | ICD-10-CM | POA: Insufficient documentation

## 2019-12-12 DIAGNOSIS — J90 Pleural effusion, not elsewhere classified: Secondary | ICD-10-CM | POA: Diagnosis not present

## 2019-12-12 DIAGNOSIS — J9811 Atelectasis: Secondary | ICD-10-CM | POA: Diagnosis not present

## 2019-12-12 MED ORDER — TRAMADOL HCL 50 MG PO TABS: 50.0000 mg | ORAL_TABLET | Freq: Four times a day (QID) | ORAL | 0 refills | Status: AC | PRN

## 2019-12-12 NOTE — Progress Notes (Signed)
a 

## 2019-12-12 NOTE — Progress Notes (Signed)
Referral has been been to Dr. 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  I have faxed over referral with demographics, insurance card and Dr. Gearldine Shown office visit note from today.  I have included my direct call back number should they need any additional information.   This was faxed to [phone]

## 2019-12-12 NOTE — Progress Notes (Signed)
[facility] OFFICE PROGRESS NOTE   Diagnosis: Esophagus cancer  INTERVAL HISTORY:   Mr. Stjames underwent an esophagectomy and a tube jejunostomy on 11/30/2019.  He was discharged from the hospital on 12/09/2019.  The feeding tube moved partially outward earlier this week and was manually reinserted and taped.  He developed drainage from the tube site beginning yesterday.  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  He is tolerating some liquids.  He had a temperature of 99 1 yesterday.  No higher fever, dyspnea, or cough.  He has developed pain at the right posterior chest tube site.  His wife reports that he is unable to tolerate oxycodone.  Tylenol has not relieved the pain.    Objective:  Vital signs in last 24 hours:  Blood pressure (!) 142/77, pulse (!) 55, temperature (!) 97.5 F (36.4 C), temperature source Tympanic, resp. rate 18, height 5\' 10"  (1.778 m), weight 155 lb 8 oz (70.5 kg).    HEENT: The mouth is moist, no thrush Resp: Lungs clear bilaterally, no respiratory distress, good air movement bilaterally Cardio: Regular rate and rhythm GI: No hepatomegaly, left upper quadrant feeding tube site with mild induration at the skin exit Vascular: No leg edema  Skin: Abdominal and neck incisions have healed, mild induration at the right posterior chest tube site    Lab Results:  Lab Results  Component Value Date   WBC 2.4 (L) 10/09/2019   HGB 12.4 (L) 10/09/2019   HCT 34.8 (L) 10/09/2019   MCV 95.3 10/09/2019   PLT 160 10/09/2019   NEUTROABS 1.7 10/09/2019    CMP  Lab Results  Component Value Date   NA 136 10/09/2019   K 4.2 10/09/2019   CL 103 10/09/2019   CO2 27 10/09/2019   GLUCOSE 84 10/09/2019   BUN 11 10/09/2019   CREATININE 0.75 10/09/2019   CALCIUM 10.1 10/09/2019   PROT 6.7 10/09/2019   ALBUMIN 3.3 (L) 10/09/2019   AST 13 (L) 10/09/2019   ALT 12 10/09/2019   ALKPHOS  68 10/09/2019   BILITOT 0.4 10/09/2019   GFRNONAA >60 10/09/2019   GFRAA >60 10/09/2019    No results found for: CEA1  No results found for: INR  Imaging:  No results found.  Medications: I have reviewed the patient's current medications.   Assessment/Plan: 1. Squamous cell carcinoma of the midesophagus ? Upper endoscopy 08/21/2019-nonobstructing mass in the mid esophagus, biopsy confirmed invasive squamous cell carcinoma, CPS 20 ? CTs 08/27/2019-elongated circumferential thickening of the midesophagus consistent with a primary esophageal malignancy, 8 mm right paraesophageal node suspicious for metastasis, very fine centrilobular pulmonary nodules felt most likely benign ? PET scan 3/71/0626-RSWNIOEVO hypermetabolic mid esophagus mass, mildly hypermetabolic high right paratracheal paraesophageal node no other evidence of metastatic disease ? EUS 09/11/2019-fungating mass in the mid esophagus at 26 cm, partially obstructing, tumor invaded the muscularis propria, 2 abnormal lymph nodes were visualized in the middle paraesophageal mediastinum, mass could not be traversed, at leastuT3uN1, lymph nodes or level 34M-not biopsied secondary to requirement to traverse tumor ? Began neoadjuvant concurrent chemoradiation with taxol 50 mg/m2 and carboplatin AUC 2 on 09/12/2019  ? Day 1 taxol/carbo 09/12/19 ? Day 8 taxol/carbo 09/18/19 ? Day 15 taxol/carbo 09/25/19 ? Day 22 Taxol/carboplatin 10/02/2019 ? Day 29 Taxol/carboplatin 10/09/2019 ? PET scan at [facility] 11/29/2019-decrease in thickening and hypermetabolic activity of the mid esophagus, no evidence of metastatic disease, paravertebral right lower  lobe pulmonary opacities favored to represent atelectasis radiation change ? Esophagectomy and tube jejunostomy 11/30/2019, residual squamous cell carcinoma-moderately differentiated, negative resection margins, treatment effect present (score 2), 0/13 nodes, pT3pN0 2.Dysphagia/odynophagia secondary to  #1 3.Alphagalmeat allergy 4.History of squamous cell skin cancer 5. Maculopapular and pustular rash concentrated to upper chest and back, scattered throughout abdomen, low back, arms - likely related to radiation field, heat, and steroids     Disposition: Mr. Unangst is recovering from the esophagectomy procedure.  He appears well today.  He will continue tube feedings.  We will asked the nutritionist to contact him regarding liquid intake.  A chest x-ray at [facility] on 12/07/2019 revealed a new small right pneumothorax and a new focal opacity at the left lung base.  We will obtain a follow-up chest x-ray today.  I reviewed the surgical pathology report with Mr. Snelling and his wife.  He has been diagnosed with pathologic stage II esophagus cancer.  He will return for an office visit on 12/25/2019.  We will discuss the indication for adjuvant nivolumab at the next office visit.  Betsy Coder, MD  12/12/2019  11:04 AM

## 2019-12-12 NOTE — Progress Notes (Signed)
Patient's wife Butch Penny calls stating he did not go to the ED as we instructed, temperature has normalized however still having other symptoms.  Spoke with Dr. Benay Spice and he will see him today at 10:30.  I called her back to let her know.  She agreed to this appointment.

## 2019-12-13 ENCOUNTER — Telehealth: Payer: Self-pay | Admitting: Oncology

## 2019-12-13 NOTE — Telephone Encounter (Signed)
Scheduled appointment per 11/3 los. Called patient, no answer. Left message with appointment date and time.

## 2019-12-13 NOTE — Progress Notes (Signed)
Spoke with [facility] (Dr. Garret Reddish Rosen's office) regarding urgent referral I had faxed over yesterday.  Was able to get an appointment with Dr. Constance Holster for Monday 11/8 at 2 pm to arrive by 1:45 wife may accompany.  I called patient's wife Raymond Hess this morning and let her know per Dr. Benay Spice chest x-ray results are good no concerns.  I also gave her information regarding this appointment with Dr. Constance Holster.  She was given the physical address [address], [place], [place].  They know to arrive by 1:45 and she can come with him to this appointment.   She verbalized an understanding and was appreciative of the call.

## 2019-12-16 DIAGNOSIS — C159 Malignant neoplasm of esophagus, unspecified: Secondary | ICD-10-CM | POA: Diagnosis not present

## 2019-12-17 ENCOUNTER — Telehealth: Payer: Self-pay

## 2019-12-17 DIAGNOSIS — Z8501 Personal history of malignant neoplasm of esophagus: Secondary | ICD-10-CM | POA: Diagnosis not present

## 2019-12-17 DIAGNOSIS — Z931 Gastrostomy status: Secondary | ICD-10-CM | POA: Diagnosis not present

## 2019-12-17 DIAGNOSIS — Z923 Personal history of irradiation: Secondary | ICD-10-CM | POA: Diagnosis not present

## 2019-12-17 DIAGNOSIS — J383 Other diseases of vocal cords: Secondary | ICD-10-CM | POA: Diagnosis not present

## 2019-12-17 DIAGNOSIS — Z9089 Acquired absence of other organs: Secondary | ICD-10-CM | POA: Diagnosis not present

## 2019-12-17 DIAGNOSIS — Z974 Presence of external hearing-aid: Secondary | ICD-10-CM | POA: Diagnosis not present

## 2019-12-17 NOTE — Telephone Encounter (Signed)
Patient's wife calls stating for past two nights when he has tried to sleep he is having increased pain on right side and has developed dry wheezing cough.  It is non productive he cannot cough anything up.  She is seeking advice.    She has put a call into Dr. Annye Asa office as well.

## 2019-12-17 NOTE — Telephone Encounter (Signed)
Per Dr. Benay Spice: Needs to f/u with Dr. Elenor Quinones for this issue. Notified wife and she reports she has already spoken with his office and got help with stronger pain meds.

## 2019-12-18 ENCOUNTER — Other Ambulatory Visit: Payer: Self-pay

## 2019-12-18 ENCOUNTER — Inpatient Hospital Stay: Payer: PPO

## 2019-12-18 NOTE — Progress Notes (Signed)
Nutrition Follow-up:  Patient with esophageal cancer, followed by Dr Benay Spice.  Patient has completed chemotherapy and radiation on 9/3.  Patient s/p esophagectomy and jejunostomy tube placement on 11/30/2019 at [facility].  Patient with vocal cord paralysis while in the hospital.  Was seen by Dr Veva Holes, ENT yesterday.  Planning injection procedure then possible surgery if does not work.    Met with patient and wife in clinic this am.  Patient is taking Dillard Essex Peptide 1.5 at 22m/hr for 18 hours (4pm-10am) via J-tube daily (~5 bottles).  Wife is flushing tube with 1051mabout 5 times per day recently increased from 3056m Patient was taken 4-5, 4oz of thickened liquids daily but ENT as of yesterday asked patient to decrease oral intake until procedure performed due to risk of aspiration.  Reports bowel movement daily with help of senna.  Reports some nausea, quesy feeling in the last few days with zofran helping.  Wife reports unsure if related to pain.  Chest tube recently removed.    Patient and wife concerned about weight loss post op and wanting to increase calories.   Patient has been walking on tread mill 3 times week for short periods of time.   Medications: reviewed  Labs: no new  Anthropometrics:   Weight 155 lb 8 oz on 11/3. Wife reports home scales 151 lb -153 lb  158 lb on 9/3   Estimated Energy Needs  Kcals: [id number]: 105-122 g Fluid: > 2.1 L  NUTRITION DIAGNOSIS: Inadequate oral intake related to vocal cord paralysis/aspiration as evidenced by ENT recommendations for minimal po intake at this time, relying on feeding tube.    INTERVENTION:  Recommend increasing KatDillard Essexptide 1.5 to 100 ml/hr for 18 hours for few days and if tolerating increase to 108m1m for 18 hours (4pm-10 am) to received 6 cartons total per day.  Recommend free water flush of 180 ml 5 times per day to better meet hydration needs as will not be taking po liquids at this time.   Tube  feeding regimen will provide 3000 calories, 144 g protein, 2262 ml free water Written instructions given to patient and wife regarding regimen.   Patient receiving tube feeding supplies from [facility].   Contact information provided  MONITORING, EVALUATION, GOAL: weight trends, tube feeding   NEXT VISIT: Nov 23 phone call   Erwin Nishiyama B. AlleZenia Resides, [place] Bone[place]istered Dietitian [phone])

## 2019-12-20 ENCOUNTER — Telehealth: Payer: Self-pay | Admitting: Nurse Practitioner

## 2019-12-20 NOTE — Telephone Encounter (Signed)
Rescheduled appointment per 11/9 provider requested time off. Spoke to patient's wife who is aware of updated appointment date and time.

## 2019-12-20 NOTE — H&P (View-Only) (Signed)
HPI:   Raymond Hess is a 69 y.o. male who presents as a consult Patient.   Referring Provider: Electa Sniff,*  Chief complaint: Loss of voice.  HPI: History of esophageal cancer. He underwent chemotherapy preop then esophagectomy about 3 weeks ago at [facility]. Since the surgery he has had no voice and trouble swallowing. He is on thickened diet but minimal oral diet. Most of his nutrition is through feeding tube.  PMH/Meds/All/SocHx/FamHx/ROS:   Past Medical History:  Diagnosis Date  . Throat cancer [facility])   Past Surgical History:  Procedure Laterality Date  . CATARACT EXTRACTION  . dental implant  . ESOPHAGECTOMY   No family history of bleeding disorders, wound healing problems or difficulty with anesthesia.   Social History   Socioeconomic History  . Marital status: Unknown  Spouse name: Not on file  . Number of children: Not on file  . Years of education: Not on file  . Highest education level: Not on file  Occupational History  . Not on file  Tobacco Use  . Smoking status: Never Smoker  . Smokeless tobacco: Never Used  Vaping Use  . Vaping Use: Never used  Substance and Sexual Activity  . Alcohol use: Not on file  . Drug use: Not on file  . Sexual activity: Not on file  Other Topics Concern  . Not on file  Social History Narrative  . Not on file   Social Determinants of Health   Financial Resource Strain:  . Difficulty of Paying Living Expenses: Not on file  Food Insecurity:  . Worried About Charity fundraiser in the Last Year: Not on file  . Ran Out of Food in the Last Year: Not on file  Transportation Needs:  . Lack of Transportation (Medical): Not on file  . Lack of Transportation (Non-Medical): Not on file  Physical Activity:  . Days of Exercise per Week: Not on file  . Minutes of Exercise per Session: Not on file  Stress:  . Feeling of Stress : Not on file  Social Connections:  . Frequency of Communication with Friends and Family: Not on  file  . Frequency of Social Gatherings with Friends and Family: Not on file  . Attends Religious Services: Not on file  . Active Member of Clubs or Organizations: Not on file  . Attends Archivist Meetings: Not on file  . Marital Status: Not on file   Current Outpatient Medications:  . acetaminophen (TYLENOL) 650 MG CR tablet, Take 650 mg by mouth., Disp: , Rfl:  . docusate sodium (COLACE) 50 mg/5 mL liquid, Take 100 mg by mouth., Disp: , Rfl:  . nutritional supplements Liqd, by Jejunal Tube route. 85 ml/hour via J-tube from 4 pm to 10 am daily, Disp: , Rfl:  . ondansetron (ZOFRAN-ODT) 4 MG disintegrating tablet, , Disp: , Rfl:  . sennosides (SENOKOT) 8.8 mg/5 mL syrup, Take 10 mLs by mouth., Disp: , Rfl:  . simethicone (MYLICON,PHAZYME) 80 MG chewable tablet, Take 80 mg by mouth., Disp: , Rfl:  . traMADoL (ULTRAM) 50 mg tablet, , Disp: , Rfl:   A complete ROS was performed with pertinent positives/negatives noted in the HPI. The remainder of the ROS are negative.   Physical Exam:   BP 126/78  Pulse 104  Temp 97.7 F (36.5 C)  Ht 1.778 m (5\' 10" )  Wt 69.9 kg (154 lb)  BMI 22.10 kg/m   General: Healthy and alert, in no distress, breathing easily. Normal affect. In  a pleasant mood. Voice is severely breathy. Head: Normocephalic, atraumatic. No masses, or scars. Eyes: Pupils are equal, and reactive to light. Vision is grossly intact. No spontaneous or gaze nystagmus. Ears: Ear canals are clear. Tympanic membranes are intact, with normal landmarks and the middle ears are clear and healthy. Hearing: Grossly diminished, he has bilateral hearing aids. Nose: Nasal cavities are clear with healthy mucosa, no polyps or exudate. Airways are patent. Face: No masses or scars, facial nerve function is symmetric. Oral Cavity: No mucosal abnormalities are noted. Tongue with normal mobility. Dentition appears healthy. Oropharynx: Tonsils are symmetric. There are no mucosal masses  identified. Tongue base appears normal and healthy. Larynx/Hypopharynx: Difficult to examine thoroughly using a mirror. Chest: Deferred Neck: No palpable masses, no cervical adenopathy, no thyroid nodules or enlargement. Neuro: Cranial nerves II-XII with normal function. Balance: Normal gate. Other findings: none.  Independent Review of Additional Tests or Records:  none  Procedures:  Procedure note: Flexible fiberoptic laryngoscopy  Details of the procedure were explained to the patient and all questions were answered.   Procedure:   After anesthetizing the nasal cavity with topical lidocaine and oxymetazoline, the flexible endoscope was introduced and passed through the nasal cavity into the nasopharynx. The scope was then advanced to the level of the oropharynx, then the hypopharynx and larynx. Scope number 20 was used.  Findings:   The posterior soft palate, uvula, tongue base and vallecula were visualized and appeared healthy without mucosal masses or lesions. The epiglottis, aryepiglottic folds, hypopharynx, supraglottis, glottis were visualized and appeared healthy without mucosal masses or lesions. Vocal fold mobility was asymmetric with significant weakening possible complete paralysis on the left, very difficult to tell for sure.   Additional findings: Mild pooling of secretions in the vallecula and piriform sinuses  The scope was withdrawn from the nose. He tolerated the procedure well.   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 Given that and the frustration he is experiencing with his severely weakened voice I would recommend surgical intervention sooner than later. We discussed 2 options 1 being endoscopic bilateral vocal cord injection with filler material.  The second would be left medialization laryngoplasty. The second operation is longer and requires an overnight stay. It tends to have better effects and more long-lasting. He would like to try the quicker and easier thing first and then we can always reserve the second procedure after that if we need to.

## 2019-12-20 NOTE — H&P (Signed)
HPI:   Raymond Hess is a 69 y.o. male who presents as a consult Patient.   Referring Provider: Electa Sniff,*  Chief complaint: Loss of voice.  HPI: History of esophageal cancer. He underwent chemotherapy preop then esophagectomy about 3 weeks ago at [facility]. Since the surgery he has had no voice and trouble swallowing. He is on thickened diet but minimal oral diet. Most of his nutrition is through feeding tube.  PMH/Meds/All/SocHx/FamHx/ROS:   Past Medical History:  Diagnosis Date  . Throat cancer [facility])   Past Surgical History:  Procedure Laterality Date  . CATARACT EXTRACTION  . dental implant  . ESOPHAGECTOMY   No family history of bleeding disorders, wound healing problems or difficulty with anesthesia.   Social History   Socioeconomic History  . Marital status: Unknown  Spouse name: Not on file  . Number of children: Not on file  . Years of education: Not on file  . Highest education level: Not on file  Occupational History  . Not on file  Tobacco Use  . Smoking status: Never Smoker  . Smokeless tobacco: Never Used  Vaping Use  . Vaping Use: Never used  Substance and Sexual Activity  . Alcohol use: Not on file  . Drug use: Not on file  . Sexual activity: Not on file  Other Topics Concern  . Not on file  Social History Narrative  . Not on file   Social Determinants of Health   Financial Resource Strain:  . Difficulty of Paying Living Expenses: Not on file  Food Insecurity:  . Worried About Charity fundraiser in the Last Year: Not on file  . Ran Out of Food in the Last Year: Not on file  Transportation Needs:  . Lack of Transportation (Medical): Not on file  . Lack of Transportation (Non-Medical): Not on file  Physical Activity:  . Days of Exercise per Week: Not on file  . Minutes of Exercise per Session: Not on file  Stress:  . Feeling of Stress : Not on file  Social Connections:  . Frequency of Communication with Friends and Family: Not on  file  . Frequency of Social Gatherings with Friends and Family: Not on file  . Attends Religious Services: Not on file  . Active Member of Clubs or Organizations: Not on file  . Attends Archivist Meetings: Not on file  . Marital Status: Not on file   Current Outpatient Medications:  . acetaminophen (TYLENOL) 650 MG CR tablet, Take 650 mg by mouth., Disp: , Rfl:  . docusate sodium (COLACE) 50 mg/5 mL liquid, Take 100 mg by mouth., Disp: , Rfl:  . nutritional supplements Liqd, by Jejunal Tube route. 85 ml/hour via J-tube from 4 pm to 10 am daily, Disp: , Rfl:  . ondansetron (ZOFRAN-ODT) 4 MG disintegrating tablet, , Disp: , Rfl:  . sennosides (SENOKOT) 8.8 mg/5 mL syrup, Take 10 mLs by mouth., Disp: , Rfl:  . simethicone (MYLICON,PHAZYME) 80 MG chewable tablet, Take 80 mg by mouth., Disp: , Rfl:  . traMADoL (ULTRAM) 50 mg tablet, , Disp: , Rfl:   A complete ROS was performed with pertinent positives/negatives noted in the HPI. The remainder of the ROS are negative.   Physical Exam:   BP 126/78  Pulse 104  Temp 97.7 F (36.5 C)  Ht 1.778 m (5\' 10" )  Wt 69.9 kg (154 lb)  BMI 22.10 kg/m   General: Healthy and alert, in no distress, breathing easily. Normal affect. In  a pleasant mood. Voice is severely breathy. Head: Normocephalic, atraumatic. No masses, or scars. Eyes: Pupils are equal, and reactive to light. Vision is grossly intact. No spontaneous or gaze nystagmus. Ears: Ear canals are clear. Tympanic membranes are intact, with normal landmarks and the middle ears are clear and healthy. Hearing: Grossly diminished, he has bilateral hearing aids. Nose: Nasal cavities are clear with healthy mucosa, no polyps or exudate. Airways are patent. Face: No masses or scars, facial nerve function is symmetric. Oral Cavity: No mucosal abnormalities are noted. Tongue with normal mobility. Dentition appears healthy. Oropharynx: Tonsils are symmetric. There are no mucosal masses  identified. Tongue base appears normal and healthy. Larynx/Hypopharynx: Difficult to examine thoroughly using a mirror. Chest: Deferred Neck: No palpable masses, no cervical adenopathy, no thyroid nodules or enlargement. Neuro: Cranial nerves II-XII with normal function. Balance: Normal gate. Other findings: none.  Independent Review of Additional Tests or Records:  none  Procedures:  Procedure note: Flexible fiberoptic laryngoscopy  Details of the procedure were explained to the patient and all questions were answered.   Procedure:   After anesthetizing the nasal cavity with topical lidocaine and oxymetazoline, the flexible endoscope was introduced and passed through the nasal cavity into the nasopharynx. The scope was then advanced to the level of the oropharynx, then the hypopharynx and larynx. Scope number 20 was used.  Findings:   The posterior soft palate, uvula, tongue base and vallecula were visualized and appeared healthy without mucosal masses or lesions. The epiglottis, aryepiglottic folds, hypopharynx, supraglottis, glottis were visualized and appeared healthy without mucosal masses or lesions. Vocal fold mobility was asymmetric with significant weakening possible complete paralysis on the left, very difficult to tell for sure.   Additional findings: Mild pooling of secretions in the vallecula and piriform sinuses  The scope was withdrawn from the nose. He tolerated the procedure well.   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 Given that and the frustration he is experiencing with his severely weakened voice I would recommend surgical intervention sooner than later. We discussed 2 options 1 being endoscopic bilateral vocal cord injection with filler material.  The second would be left medialization laryngoplasty. The second operation is longer and requires an overnight stay. It tends to have better effects and more long-lasting. He would like to try the quicker and easier thing first and then we can always reserve the second procedure after that if we need to.

## 2019-12-21 ENCOUNTER — Telehealth: Payer: Self-pay

## 2019-12-21 ENCOUNTER — Encounter (HOSPITAL_COMMUNITY): Payer: Self-pay | Admitting: Emergency Medicine

## 2019-12-21 ENCOUNTER — Emergency Department (HOSPITAL_COMMUNITY): Payer: PPO

## 2019-12-21 ENCOUNTER — Other Ambulatory Visit: Payer: Self-pay

## 2019-12-21 ENCOUNTER — Emergency Department (HOSPITAL_COMMUNITY)
Admission: EM | Admit: 2019-12-21 | Discharge: 2019-12-21 | Payer: PPO | Attending: Emergency Medicine | Admitting: Emergency Medicine

## 2019-12-21 DIAGNOSIS — J189 Pneumonia, unspecified organism: Secondary | ICD-10-CM | POA: Diagnosis not present

## 2019-12-21 DIAGNOSIS — Z20822 Contact with and (suspected) exposure to covid-19: Secondary | ICD-10-CM | POA: Insufficient documentation

## 2019-12-21 DIAGNOSIS — J90 Pleural effusion, not elsewhere classified: Secondary | ICD-10-CM | POA: Diagnosis not present

## 2019-12-21 DIAGNOSIS — R0600 Dyspnea, unspecified: Secondary | ICD-10-CM | POA: Diagnosis not present

## 2019-12-21 DIAGNOSIS — R5381 Other malaise: Secondary | ICD-10-CM | POA: Diagnosis not present

## 2019-12-21 DIAGNOSIS — Z888 Allergy status to other drugs, medicaments and biological substances status: Secondary | ICD-10-CM | POA: Diagnosis not present

## 2019-12-21 DIAGNOSIS — J3800 Paralysis of vocal cords and larynx, unspecified: Secondary | ICD-10-CM | POA: Diagnosis not present

## 2019-12-21 DIAGNOSIS — I2699 Other pulmonary embolism without acute cor pulmonale: Secondary | ICD-10-CM | POA: Diagnosis not present

## 2019-12-21 DIAGNOSIS — R9389 Abnormal findings on diagnostic imaging of other specified body structures: Secondary | ICD-10-CM | POA: Diagnosis not present

## 2019-12-21 DIAGNOSIS — Z8501 Personal history of malignant neoplasm of esophagus: Secondary | ICD-10-CM | POA: Insufficient documentation

## 2019-12-21 DIAGNOSIS — J9811 Atelectasis: Secondary | ICD-10-CM | POA: Diagnosis not present

## 2019-12-21 DIAGNOSIS — R1110 Vomiting, unspecified: Secondary | ICD-10-CM | POA: Insufficient documentation

## 2019-12-21 DIAGNOSIS — Z923 Personal history of irradiation: Secondary | ICD-10-CM | POA: Diagnosis not present

## 2019-12-21 DIAGNOSIS — Z791 Long term (current) use of non-steroidal anti-inflammatories (NSAID): Secondary | ICD-10-CM | POA: Diagnosis not present

## 2019-12-21 DIAGNOSIS — Z9049 Acquired absence of other specified parts of digestive tract: Secondary | ICD-10-CM | POA: Diagnosis not present

## 2019-12-21 DIAGNOSIS — Z9221 Personal history of antineoplastic chemotherapy: Secondary | ICD-10-CM | POA: Insufficient documentation

## 2019-12-21 DIAGNOSIS — Z79899 Other long term (current) drug therapy: Secondary | ICD-10-CM | POA: Diagnosis not present

## 2019-12-21 DIAGNOSIS — Z934 Other artificial openings of gastrointestinal tract status: Secondary | ICD-10-CM | POA: Diagnosis not present

## 2019-12-21 DIAGNOSIS — C159 Malignant neoplasm of esophagus, unspecified: Secondary | ICD-10-CM | POA: Diagnosis not present

## 2019-12-21 DIAGNOSIS — C154 Malignant neoplasm of middle third of esophagus: Secondary | ICD-10-CM | POA: Diagnosis not present

## 2019-12-21 DIAGNOSIS — R0982 Postnasal drip: Secondary | ICD-10-CM | POA: Diagnosis not present

## 2019-12-21 DIAGNOSIS — R000 Tachycardia, unspecified: Secondary | ICD-10-CM | POA: Diagnosis not present

## 2019-12-21 DIAGNOSIS — R1310 Dysphagia, unspecified: Secondary | ICD-10-CM | POA: Diagnosis not present

## 2019-12-21 DIAGNOSIS — Z885 Allergy status to narcotic agent status: Secondary | ICD-10-CM | POA: Diagnosis not present

## 2019-12-21 DIAGNOSIS — Z886 Allergy status to analgesic agent status: Secondary | ICD-10-CM | POA: Diagnosis not present

## 2019-12-21 DIAGNOSIS — R509 Fever, unspecified: Secondary | ICD-10-CM | POA: Diagnosis present

## 2019-12-21 HISTORY — DX: Malignant (primary) neoplasm, unspecified: C80.1

## 2019-12-21 LAB — URINALYSIS, ROUTINE W REFLEX MICROSCOPIC
Bilirubin Urine: NEGATIVE
Glucose, UA: NEGATIVE mg/dL
Hgb urine dipstick: NEGATIVE
Ketones, ur: NEGATIVE mg/dL
Leukocytes,Ua: NEGATIVE
Nitrite: NEGATIVE
Protein, ur: NEGATIVE mg/dL
Specific Gravity, Urine: 1.025 (ref 1.005–1.030)
pH: 7 (ref 5.0–8.0)

## 2019-12-21 LAB — RESPIRATORY PANEL BY RT PCR (FLU A&B, COVID)
Influenza A by PCR: NEGATIVE
Influenza B by PCR: NEGATIVE
SARS Coronavirus 2 by RT PCR: NEGATIVE

## 2019-12-21 LAB — CBC WITH DIFFERENTIAL/PLATELET
Abs Immature Granulocytes: 0.06 10*3/uL (ref 0.00–0.07)
Basophils Absolute: 0 10*3/uL (ref 0.0–0.1)
Basophils Relative: 0 %
Eosinophils Absolute: 0.1 10*3/uL (ref 0.0–0.5)
Eosinophils Relative: 1 %
HCT: 32.5 % — ABNORMAL LOW (ref 39.0–52.0)
Hemoglobin: 11.2 g/dL — ABNORMAL LOW (ref 13.0–17.0)
Immature Granulocytes: 1 %
Lymphocytes Relative: 6 %
Lymphs Abs: 0.6 10*3/uL — ABNORMAL LOW (ref 0.7–4.0)
MCH: 33.9 pg (ref 26.0–34.0)
MCHC: 34.5 g/dL (ref 30.0–36.0)
MCV: 98.5 fL (ref 80.0–100.0)
Monocytes Absolute: 1.3 10*3/uL — ABNORMAL HIGH (ref 0.1–1.0)
Monocytes Relative: 12 %
Neutro Abs: 8.5 10*3/uL — ABNORMAL HIGH (ref 1.7–7.7)
Neutrophils Relative %: 80 %
Platelets: 306 10*3/uL (ref 150–400)
RBC: 3.3 MIL/uL — ABNORMAL LOW (ref 4.22–5.81)
RDW: 12.7 % (ref 11.5–15.5)
WBC: 10.6 10*3/uL — ABNORMAL HIGH (ref 4.0–10.5)
nRBC: 0 % (ref 0.0–0.2)

## 2019-12-21 LAB — COMPREHENSIVE METABOLIC PANEL
ALT: 48 U/L — ABNORMAL HIGH (ref 0–44)
AST: 29 U/L (ref 15–41)
Albumin: 2.6 g/dL — ABNORMAL LOW (ref 3.5–5.0)
Alkaline Phosphatase: 96 U/L (ref 38–126)
Anion gap: 11 (ref 5–15)
BUN: 19 mg/dL (ref 8–23)
CO2: 24 mmol/L (ref 22–32)
Calcium: 8.8 mg/dL — ABNORMAL LOW (ref 8.9–10.3)
Chloride: 99 mmol/L (ref 98–111)
Creatinine, Ser: 0.58 mg/dL — ABNORMAL LOW (ref 0.61–1.24)
GFR, Estimated: >60 mL/min (ref >60)
Glucose, Bld: 123 mg/dL — ABNORMAL HIGH (ref 70–99)
Potassium: 4.9 mmol/L (ref 3.5–5.1)
Sodium: 134 mmol/L — ABNORMAL LOW (ref 135–145)
Total Bilirubin: 0.5 mg/dL (ref 0.3–1.2)
Total Protein: 6.6 g/dL (ref 6.5–8.1)

## 2019-12-21 LAB — LACTIC ACID, PLASMA: Lactic Acid, Venous: 1 mmol/L (ref 0.5–1.9)

## 2019-12-21 MED ORDER — IOHEXOL 350 MG/ML SOLN
100.0000 mL | Freq: Once | INTRAVENOUS | Status: AC | PRN
  Administered 2019-12-21: 67 mL via INTRAVENOUS

## 2019-12-21 MED ORDER — ACETAMINOPHEN 160 MG/5ML PO SOLN
650.0000 mg | Freq: Once | ORAL | Status: AC
  Administered 2019-12-21: 650 mg
  Filled 2019-12-21: qty 20.3

## 2019-12-21 MED ORDER — ONDANSETRON HCL 4 MG/2ML IJ SOLN
4.0000 mg | Freq: Once | INTRAMUSCULAR | Status: AC
  Administered 2019-12-21: 4 mg via INTRAVENOUS
  Filled 2019-12-21 (×2): qty 2

## 2019-12-21 MED ORDER — PIPERACILLIN-TAZOBACTAM 3.375 G IVPB 30 MIN
3.3750 g | Freq: Once | INTRAVENOUS | Status: AC
  Administered 2019-12-21: 3.375 g via INTRAVENOUS
  Filled 2019-12-21: qty 50

## 2019-12-21 MED ORDER — SODIUM CHLORIDE 0.9 % IV BOLUS (SEPSIS)
500.0000 mL | Freq: Once | INTRAVENOUS | Status: AC
  Administered 2019-12-21: 500 mL via INTRAVENOUS

## 2019-12-21 MED ORDER — SODIUM CHLORIDE (PF) 0.9 % IJ SOLN
INTRAMUSCULAR | Status: AC
  Filled 2019-12-21: qty 50

## 2019-12-21 MED ORDER — SODIUM CHLORIDE 0.9 % IV SOLN
1000.0000 mL | INTRAVENOUS | Status: DC
  Administered 2019-12-21: 1000 mL via INTRAVENOUS

## 2019-12-21 NOTE — Telephone Encounter (Signed)
Patient's wife Butch Penny calls stating that patient had onset of fever last evening and all through the night 99.6 to 101, elevated pulse rate 118-120s, nausea with one episode of vomiting, feeling poorly.  She called Dr. Florentina Jenny office at [facility] where he had his esophageal surgery and was told that they felt this was unrelated to his surgery and he should seek evaluation locally.  She is taking him to [facility] ED for evaluation.  Dr. Benay Spice is aware.

## 2019-12-21 NOTE — ED Notes (Signed)
Patient will be ED-ED transfer at [facility]. Will call report @ [phone] once patient is transferring per [facility]

## 2019-12-21 NOTE — ED Notes (Signed)
Pt transported to xray 

## 2019-12-21 NOTE — ED Notes (Signed)
MD in room

## 2019-12-21 NOTE — ED Provider Notes (Signed)
  Provider Note MRN:  [id number]  Arrival date & time: 12/21/19    ED Course and Medical Decision Making  Assumed care from Dr. Tomi Bamberger at shift change.  Malaise, possible fever, persistent tachycardia, CT with evidence of esophageal anastomotic dehiscence with pneumonia.  Plan is to discuss with [facility] surgeon to determine appropriateness of transfer to [facility].  Accepted for ED to ED transfer to [facility].  Dr. Owens Shark is the accepting physician (cardiothoracic surgeon).  On my evaluation patient continues to look generally well, normal vital signs.  .Critical Care Performed by: Maudie Flakes, MD Authorized by: Maudie Flakes, MD   Critical care provider statement:    Critical care time (minutes):  31   Critical care was necessary to treat or prevent imminent or life-threatening deterioration of the following conditions: Concern for esophageal anastomotic dehiscence.   Critical care was time spent personally by me on the following activities:  Discussions with consultants, evaluation of patient's response to treatment, examination of patient, ordering and performing treatments and interventions, ordering and review of laboratory studies, ordering and review of radiographic studies, pulse oximetry, re-evaluation of patient's condition, obtaining history from patient or surrogate and review of old charts   I assumed direction of critical care for this patient from another provider in my specialty: yes      Final Clinical Impressions(s) / ED Diagnoses     ICD-10-CM   1. Pneumonia due to infectious organism, unspecified laterality, unspecified part of lung  J18.9     ED Discharge Orders    None      Discharge Instructions   None     Barth Kirks. [place]onia Small, Daniel [email or web address]    Maudie Flakes, MD 12/21/19 [phone]

## 2019-12-21 NOTE — ED Provider Notes (Signed)
[facility] DEPT Provider Note   CSN: [id number] Arrival date & time: 12/21/19  5809     History Chief Complaint  Patient presents with  . Fever  . chemo patient    ARMANDO BUKHARI is a 69 y.o. male.  HPI   Patient presents to the emergency room for evaluation of generalized malaise and possible fever.  Patient had an esophagectomy 2 weeks ago at [facility] for treatment of esophageal cancer.  Patient's last chemo treatment was in August.  He did have radiation treatments in September.  Patient had been doing well since surgery.  He had been active and going for walks.  He is still n.p.o. 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  Patient started to have some generalized malaise last evening.  Wife was checking his blood pressure and pulse and noted that it was more rapid than usual.  He started to have low-grade temperature up to 100.6.  He did have an episode of vomiting.  He has not been coughing.  He has not been having any increasing pain.  No urinary discomfort.  They called the surgeon at [facility] who recommended evaluation.  Past Medical History:  Diagnosis Date  . Angio-edema   . Cancer ([place])   . Urticaria     Patient Active Problem List   Diagnosis Date Noted  . 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 CATARACT EXTRACTION, BILATERAL Bilateral   . ESOPHAGOGASTRODUODENOSCOPY (EGD) WITH PROPOFOL N/A 09/11/2019   Procedure: ESOPHAGOGASTRODUODENOSCOPY (EGD) WITH PROPOFOL;  Surgeon: Arta Silence, MD;  Location: [facility] ENDOSCOPY;  Service: Endoscopy;  Laterality: N/A;  . EUS N/A 09/11/2019   Procedure: UPPER ENDOSCOPIC ULTRASOUND (EUS) RADIAL;  Surgeon: Arta Silence, MD;  Location: [facility] ENDOSCOPY;  Service: Endoscopy;  Laterality: N/A;       No family  history on file.  Social History   Tobacco Use  . Smoking status: Never Smoker  . Smokeless tobacco: Never Used  Vaping Use  . Vaping Use: Never used  Substance Use Topics  . Alcohol use: Yes    Alcohol/week: 2.0 standard drinks    Types: 1 Glasses of wine, 1 Cans of beer per week  . Drug use: Never    Home Medications Prior to Admission medications   Medication Sig Start Date End Date Taking? Authorizing Provider  acetaminophen (TYLENOL) 500 MG tablet Take 1,000 mg by mouth every 8 (eight) hours as needed for moderate pain.    [provider]  diphenhydrAMINE (BENADRYL) 25 MG tablet Take 25 mg by mouth daily as needed for itching (hives).    [provider]  Nutritional Supplements ([place] FARMS PED PEPTIDE 1.5) LIQD by Jejunal Tube route. 100 ml/hour via J-tube from 4 pm to 10 am daily    [provider]  ondansetron (ZOFRAN) 4 MG tablet Take 4 mg by mouth every 8 (eight) hours as needed for nausea or vomiting.    [provider]  prochlorperazine (COMPAZINE) 10 MG tablet Take 10 mg by mouth every 6 (six) hours as needed for nausea or vomiting.     [provider]  simethicone (MYLICON) 983 MG chewable tablet Chew 125 mg by mouth every 6 (six) hours as needed for flatulence.    [provider]  traMADol (ULTRAM) 50 MG  tablet Take 1 tablet (50 mg total) by mouth every 6 (six) hours as needed for moderate pain. 12/12/19   Ladell Pier, MD    Allergies    Alpha-gal and Oxycodone hcl  Review of Systems   Review of Systems  All other systems reviewed and are negative.   Physical Exam Updated Vital Signs BP 120/79   Pulse 100   Temp 98.2 F (36.8 C) (Oral)   Resp 20   Ht 1.778 m (5\' 10" )   Wt 70 kg   SpO2 96%   BMI 22.14 kg/m   Physical Exam Vitals and nursing note reviewed.  Constitutional:      General: He is not in acute distress.    Appearance: He is well-developed.  HENT:     Head: Normocephalic and  atraumatic.     Right Ear: External ear normal.     Left Ear: External ear normal.     Mouth/Throat:     Pharynx: No oropharyngeal exudate or posterior oropharyngeal erythema.  Eyes:     General: No scleral icterus.       Right eye: No discharge.        Left eye: No discharge.     Conjunctiva/sclera: Conjunctivae normal.  Neck:     Trachea: No tracheal deviation.  Cardiovascular:     Rate and Rhythm: Normal rate and regular rhythm.  Pulmonary:     Effort: Pulmonary effort is normal. No respiratory distress.     Breath sounds: Normal breath sounds. No stridor. No wheezing or rales.  Abdominal:     General: Bowel sounds are normal. There is no distension.     Palpations: Abdomen is soft.     Tenderness: There is no abdominal tenderness. There is no guarding or rebound.     Comments: Incisions without erythema  Musculoskeletal:        General: No tenderness.     Cervical back: Neck supple.  Skin:    General: Skin is warm and dry.     Findings: No rash.  Neurological:     Mental Status: He is alert.     Cranial Nerves: No cranial nerve deficit (no facial droop, extraocular movements intact, no slurred speech).     Sensory: No sensory deficit.     Motor: No abnormal muscle tone or seizure activity.     Coordination: Coordination normal.     ED Results / Procedures / Treatments   Labs (all labs ordered are listed, but only abnormal results are displayed) Labs Reviewed  COMPREHENSIVE METABOLIC PANEL - Abnormal; Notable for the following components:      Result Value   Sodium 134 (*)    Glucose, Bld 123 (*)    Creatinine, Ser 0.58 (*)    Calcium 8.8 (*)    Albumin 2.6 (*)    ALT 48 (*)    All other components within normal limits  CBC WITH DIFFERENTIAL/PLATELET - Abnormal; Notable for the following components:   WBC 10.6 (*)    RBC 3.30 (*)    Hemoglobin 11.2 (*)    HCT 32.5 (*)    Neutro Abs 8.5 (*)    Lymphs Abs 0.6 (*)    Monocytes Absolute 1.3 (*)    All other  components within normal limits  RESPIRATORY PANEL BY RT PCR (FLU A&B, COVID)  URINALYSIS, ROUTINE W REFLEX MICROSCOPIC  LACTIC ACID, PLASMA    EKG EKG Interpretation  Date/Time:  Friday December 21 2019 10:58:22 EST Ventricular Rate:  109 PR Interval:    QRS Duration: 86 QT Interval:  322 QTC Calculation: 434 R Axis:   37 Text Interpretation: Sinus tachycardia Borderline low voltage, extremity leads No old tracing to compare Confirmed by Dorie Rank [phone]) on 12/21/2019 11:01:51 AM   Radiology DG Chest 2 View  Result Date: 12/21/2019 CLINICAL DATA:  Dyspnea and fever, recent esophageal surgery EXAM: CHEST - 2 VIEW COMPARISON:  12/12/2019 chest radiograph. FINDINGS: Stable cardiomediastinal silhouette with normal heart size. No pneumothorax. Small left pleural effusion. No right pleural effusion. No pulmonary edema. Streaky bilateral lower lobe opacities with some volume loss. IMPRESSION: 1. Small left pleural effusion. 2. Streaky bilateral lower lobe opacities with some volume loss, favoring atelectasis. Electronically Signed   By: Ilona Sorrel M.D.   On: 12/21/2019 10:57   CT Angio Chest PE W and/or Wo Contrast  Result Date: 12/21/2019 CLINICAL DATA:  Esophageal cancer status post chemotherapy and radiation therapy with esophagectomy 3 weeks prior, now with fever, nausea and vomiting. EXAM: CT ANGIOGRAPHY CHEST WITH CONTRAST TECHNIQUE: Multidetector CT imaging of the chest was performed using the standard protocol during bolus administration of intravenous contrast. Multiplanar CT image reconstructions and MIPs were obtained to evaluate the vascular anatomy. CONTRAST:  26mL OMNIPAQUE IOHEXOL 350 MG/ML SOLN COMPARISON:  08/31/2019 PET-CT. 08/27/2019 chest CT. Chest radiograph from earlier today. FINDINGS: Cardiovascular: The study is high quality for the evaluation of pulmonary embolism. There is a small segmental pulmonary embolus in the medial left lower lobe, slightly eccentric  (series 6/image 156). No additional pulmonary emboli. No central pulmonary emboli. Great vessels are normal in course and caliber. Normal heart size. No significant pericardial fluid/thickening. Left anterior descending coronary atherosclerosis. Mediastinum/Nodes: No discrete thyroid nodules. Status post esophagectomy with gastric pull-through with esophagogastric anastomosis in the upper mediastinum. The right sided wall of the esophagogastric anastomosis is indistinct with patchy gas containing perianastomotic debris in the high right mediastinum bulging into the medial right upper pleural space with surrounding patchy lung consolidation (series 5/image 39). No pathologically enlarged axillary, mediastinal or hilar lymph nodes. Lungs/Pleura: No pneumothorax. Small dependent left pleural effusion. No right pleural effusion. New patchy consolidation in the paramediastinal right upper lobe with surrounding patchy ground-glass opacity. Platelike atelectasis in right lower lobe with associated volume loss. Passive moderate to marked dependent left lower lobe atelectasis. No discrete pulmonary nodules. Upper abdomen: No acute abnormality. Musculoskeletal: No aggressive appearing focal osseous lesions. Moderate thoracic spondylosis. Review of the MIP images confirms the above findings. IMPRESSION: 1. Small segmental medial left lower lobe pulmonary embolus, slightly eccentric, favoring subacute/chronic. No compelling findings of acute pulmonary embolism. 2. Interval esophagectomy with gastric pull-through. Indistinct right-sided wall of the esophagogastric anastomosis in the high right mediastinum with patchy gas-containing perianastomotic debris bulging into the medial right upper pleural space with surrounding patchy lung consolidation. Findings raise concern for anastomotic dehiscence and developing reactive pneumonia in the right upper lobe. 3. Small dependent left pleural effusion. Bibasilar atelectasis. 4. One  vessel coronary atherosclerosis. 5. Aortic Atherosclerosis (ICD10-I70.0). These results were called by telephone at the time of interpretation on 12/21/2019 at 2:26 pm to provider [facility] , who verbally acknowledged these results. Electronically Signed   By: Ilona Sorrel M.D.   On: 12/21/2019 14:29    Procedures Procedures (including critical care time)  Medications Ordered in ED Medications  sodium chloride 0.9 % bolus 500 mL (0 mLs Intravenous Stopped 12/21/19 1422)    Followed by  0.9 %  sodium chloride infusion (1,000 mLs  Intravenous New Bag/Given 12/21/19 1454)  ondansetron (ZOFRAN) injection 4 mg (4 mg Intravenous Given 12/21/19 1431)  sodium chloride (PF) 0.9 % injection (  Given by Other 12/21/19 1446)  iohexol (OMNIPAQUE) 350 MG/ML injection 100 mL (67 mLs Intravenous Contrast Given 12/21/19 1351)  acetaminophen (TYLENOL) 160 MG/5ML solution 650 mg (650 mg Per Tube Given 12/21/19 1440)  piperacillin-tazobactam (ZOSYN) IVPB 3.375 g (3.375 g Intravenous New Bag/Given 12/21/19 1456)    ED Course  I have reviewed the triage vital signs and the nursing notes.  Pertinent labs & imaging results that were available during my care of the patient were reviewed by me and considered in my medical decision making (see chart for details).  Clinical Course as of Dec 20 1536  Fri Dec 21, 2019  1154 White count increased at 10.6.  Hemoglobin hematocrit normal at 11 and 32.  Metabolic panel notable for decreased albumin   [JK]  1212 Covid and UA negative.    [NU]  2725 CXR with atelectasis and small effusion   [JK]  1223 Patient still noted to have persistent tachycardia.  With his recent surgery PE is a concern.  I will proceed with CT scan   [JK]  3664 Discussed with radiology.  Concerned about possible anastamotic breakdown.  And possible pna.  Would need water soluble esophagram.  Appears to have tiny pe, probably subacute or chronic   [JK]  1505 Discussed with [facility] Transfer.   Images  will be powershared to [facility].  Dr Otilio Connors will be called once images are available.   [JK]    Clinical Course User Index [JK] Dorie Rank, MD   MDM Rules/Calculators/A&P                          Patient presented to ED for evaluation of malaise.  Complicated medical history with esophagectomy on October 22.  Remote history of chemo and radiation treatment.  Patient had low-grade temperature.  Was concerned about the possibility of pneumonia or urinary tract infection.  PE was also a concern with his persistent tachycardia.  No signs of sepsis.  CT scan was performed and unfortunately does show findings of pneumonia and possible anastomotic leak.  I have contacted [facility] as the patient just had surgery there on October 22.  I have spoken with the transfer center.  I am waiting for a call back from Dr. Otilio Connors or the covering physician regarding possible transfer to [facility].  Dispo pending.  Dr [place] Small will follow up on phone call from [facility].  Final Clinical Impression(s) / ED Diagnoses Final diagnoses:  Pneumonia due to infectious organism, unspecified laterality, unspecified part of lung      Dorie Rank, MD 12/21/19 1542

## 2019-12-21 NOTE — ED Notes (Signed)
Pt able to ambulate to bathroom independently.

## 2019-12-21 NOTE — ED Notes (Signed)
Pt ambulatory from triage 

## 2019-12-21 NOTE — ED Triage Notes (Signed)
2 weeks post-op from esophageal operation at [place], noted to have a temp of 100.6 last night, wife speaks for pt and he is nauseated, one episode of emesis this morning. Last chemo trt was August 31st, radiation done in September. 500mg  Tylenol at 3 am this morning.

## 2019-12-22 ENCOUNTER — Other Ambulatory Visit (HOSPITAL_COMMUNITY): Payer: PPO

## 2019-12-24 ENCOUNTER — Encounter (HOSPITAL_COMMUNITY): Payer: Self-pay | Admitting: Otolaryngology

## 2019-12-24 ENCOUNTER — Other Ambulatory Visit: Payer: Self-pay

## 2019-12-24 ENCOUNTER — Telehealth: Payer: Self-pay

## 2019-12-24 NOTE — Progress Notes (Signed)
I spoke to Mrs. Raymond Hess, [name]'s wife. [name] can speak in a low whisper and it wears him out, we have permission to speak  with Raymond Hess. Raymond Hess reports that [name] does not have chest pain or shortness of breath. [name] tested negative for ovid on 12/21/19 at [facility].  [name] was transfered to [facility] from [place]. [name] was feeling fatigue had a fever and fast heart rate, patient had surgery at [facility] 10/            Raymond Hess was treated with IV antibiotics and discharged home on 12/23/19.  [name] reports that Raymond Hess has a tempeture of 100.0 this am.  Mrs. Raymond Hess is concerned that if patient has an  Elevated temperature on Tuesday that surgery would be cancelled. I informed Mrs. Raymond Hess that I would speak to an anesthesiologist for input.  I instructed Mrs. Raymond Hess to stop J tube feeding at [facility]. Mrs. Raymond Hess said that the antibiotics are giving [name] diarrhea, I instructed Mrs. Raymond Hess to not give the antibiotics in am.  I spoke to Dr.Stolzfus in anesthesiology and gave him the history and wife's questions regarding temperature and stopping the tube feeding at mn. [name] said that I it appears to be very important that patient have surgery asap, -temperture would not be an issue. I also shared Mrs. Raymond Hess's concern about stopping tube feeding at [place]n, [name] said yes, to start the tube feeding a little early today - at 2 pm instead of 4 pm.  I called [name]  And shared the information with her.  I called Dr. Janeice Robinson office and left a voice message for Raymond Hess, I wanted to make sure that Dr, Raymond Hess is aware that [name] was admitted  at [facility] with a fever and Sinus Tachycardia. I also shared [name]'s concerns.   Patient has a cough from Fall allergies, he is taking Mucinex.

## 2019-12-24 NOTE — Telephone Encounter (Signed)
Patient's wife calls just to give [place] an update on the patient.  He was discharged from [place] last evening.  According to her they were told they were not sure if there is a pneumonia or leak from his surgery.  He was started on Augmentin & Diflucan and feeling okay for now.  He is scheduled with Dr. Constance Holster tomorrow to have the injection for his vocal cord paralysis and to follow up with Dr. Elenor Quinones on Thursday 11/18.

## 2019-12-25 ENCOUNTER — Encounter (HOSPITAL_COMMUNITY): Payer: Self-pay | Admitting: Otolaryngology

## 2019-12-25 ENCOUNTER — Ambulatory Visit: Payer: PPO | Admitting: Oncology

## 2019-12-25 ENCOUNTER — Encounter (HOSPITAL_COMMUNITY)
Admission: RE | Disposition: A | Discharge status: Home / Self Care | Payer: Self-pay | Source: Home / Self Care | Attending: Otolaryngology

## 2019-12-25 ENCOUNTER — Ambulatory Visit (HOSPITAL_COMMUNITY)
Admission: RE | Admit: 2019-12-25 | Discharge: 2019-12-25 | Disposition: A | Discharge status: Home / Self Care | Payer: PPO | Attending: Otolaryngology | Admitting: Otolaryngology

## 2019-12-25 ENCOUNTER — Ambulatory Visit (HOSPITAL_COMMUNITY): Payer: PPO | Admitting: Anesthesiology

## 2019-12-25 ENCOUNTER — Other Ambulatory Visit: Payer: Self-pay

## 2019-12-25 DIAGNOSIS — J3801 Paralysis of vocal cords and larynx, unilateral: Secondary | ICD-10-CM | POA: Diagnosis not present

## 2019-12-25 DIAGNOSIS — R491 Aphonia: Secondary | ICD-10-CM | POA: Insufficient documentation

## 2019-12-25 DIAGNOSIS — C154 Malignant neoplasm of middle third of esophagus: Secondary | ICD-10-CM | POA: Diagnosis not present

## 2019-12-25 DIAGNOSIS — C159 Malignant neoplasm of esophagus, unspecified: Secondary | ICD-10-CM | POA: Diagnosis not present

## 2019-12-25 DIAGNOSIS — D630 Anemia in neoplastic disease: Secondary | ICD-10-CM | POA: Diagnosis not present

## 2019-12-25 DIAGNOSIS — J45909 Unspecified asthma, uncomplicated: Secondary | ICD-10-CM | POA: Diagnosis not present

## 2019-12-25 HISTORY — PX: MICROLARYNGOSCOPY: SHX5208

## 2019-12-25 HISTORY — DX: Other complications of anesthesia, initial encounter: T88.59XA

## 2019-12-25 SURGERY — MICROLARYNGOSCOPY
Anesthesia: General | Site: Throat | Laterality: Bilateral

## 2019-12-25 MED ORDER — CHLORHEXIDINE GLUCONATE 0.12 % MT SOLN
15.0000 mL | Freq: Once | OROMUCOSAL | Status: AC
  Administered 2019-12-25: 15 mL via OROMUCOSAL
  Filled 2019-12-25: qty 15

## 2019-12-25 MED ORDER — PHENYLEPHRINE HCL (PRESSORS) 10 MG/ML IV SOLN
INTRAVENOUS | Status: DC | PRN
  Administered 2019-12-25: 50 ug via INTRAVENOUS

## 2019-12-25 MED ORDER — LIDOCAINE 2% (20 MG/ML) 5 ML SYRINGE
INTRAMUSCULAR | Status: DC | PRN
  Administered 2019-12-25: 40 mg via INTRAVENOUS

## 2019-12-25 MED ORDER — FENTANYL CITRATE (PF) 250 MCG/5ML IJ SOLN
INTRAMUSCULAR | Status: DC | PRN
  Administered 2019-12-25 (×2): 50 ug via INTRAVENOUS

## 2019-12-25 MED ORDER — LACTATED RINGERS IV SOLN: INTRAVENOUS | Status: DC

## 2019-12-25 MED ORDER — FENTANYL CITRATE (PF) 250 MCG/5ML IJ SOLN
INTRAMUSCULAR | Status: AC
  Filled 2019-12-25: qty 5

## 2019-12-25 MED ORDER — 0.9 % SODIUM CHLORIDE (POUR BTL) OPTIME
TOPICAL | Status: DC | PRN
  Administered 2019-12-25: 1000 mL

## 2019-12-25 MED ORDER — PROPOFOL 10 MG/ML IV BOLUS
INTRAVENOUS | Status: DC | PRN
  Administered 2019-12-25: 50 mg via INTRAVENOUS
  Administered 2019-12-25: 130 mg via INTRAVENOUS

## 2019-12-25 MED ORDER — SUCCINYLCHOLINE CHLORIDE 20 MG/ML IJ SOLN
INTRAMUSCULAR | Status: DC | PRN
  Administered 2019-12-25: 80 mg via INTRAVENOUS

## 2019-12-25 MED ORDER — MIDAZOLAM HCL 2 MG/2ML IJ SOLN
INTRAMUSCULAR | Status: AC
  Filled 2019-12-25: qty 2

## 2019-12-25 MED ORDER — PROPOFOL 10 MG/ML IV BOLUS
INTRAVENOUS | Status: AC
  Filled 2019-12-25: qty 20

## 2019-12-25 MED ORDER — DEXAMETHASONE SODIUM PHOSPHATE 10 MG/ML IJ SOLN
INTRAMUSCULAR | Status: DC | PRN
  Administered 2019-12-25: 10 mg via INTRAVENOUS

## 2019-12-25 MED ORDER — MIDAZOLAM HCL 5 MG/5ML IJ SOLN
INTRAMUSCULAR | Status: DC | PRN
  Administered 2019-12-25: 2 mg via INTRAVENOUS

## 2019-12-25 MED ORDER — ONDANSETRON HCL 4 MG/2ML IJ SOLN
INTRAMUSCULAR | Status: DC | PRN
  Administered 2019-12-25: 4 mg via INTRAVENOUS

## 2019-12-25 MED ORDER — OXYMETAZOLINE HCL 0.05 % NA SOLN
NASAL | Status: AC
  Filled 2019-12-25: qty 30

## 2019-12-25 MED ORDER — EPINEPHRINE HCL (NASAL) 0.1 % NA SOLN
NASAL | Status: AC
  Filled 2019-12-25: qty 30

## 2019-12-25 MED ORDER — ORAL CARE MOUTH RINSE: 15.0000 mL | Freq: Once | OROMUCOSAL | Status: AC

## 2019-12-25 SURGICAL SUPPLY — 35 items
BNDG EYE OVAL (GAUZE/BANDAGES/DRESSINGS) ×1 IMPLANT
CANISTER SUCT 3000ML PPV (MISCELLANEOUS) ×3 IMPLANT
CNTNR URN SCR LID CUP LEK RST (MISCELLANEOUS) ×2 IMPLANT
CONT SPEC 4OZ STRL OR WHT (MISCELLANEOUS)
COVER BACK TABLE 60X90IN (DRAPES) ×3 IMPLANT
COVER MAYO STAND STRL (DRAPES) ×3 IMPLANT
COVER WAND RF STERILE (DRAPES) ×1 IMPLANT
DRAPE HALF SHEET 40X57 (DRAPES) ×3 IMPLANT
DRSG TELFA 3X8 NADH (GAUZE/BANDAGES/DRESSINGS) IMPLANT
ELECT REM PT RETURN 9FT ADLT (ELECTROSURGICAL)
ELECTRODE REM PT RTRN 9FT ADLT (ELECTROSURGICAL) IMPLANT
GAUZE 4X4 16PLY RFD (DISPOSABLE) ×1 IMPLANT
GLOVE ECLIPSE 7.5 STRL STRAW (GLOVE) ×3 IMPLANT
GUARD TEETH (MISCELLANEOUS) ×2 IMPLANT
KIT BASIN OR (CUSTOM PROCEDURE TRAY) ×3 IMPLANT
KIT PROLARN PLUS GEL W/NDL (Prosthesis and Implant ENT) ×2 IMPLANT
KIT TURNOVER KIT B (KITS) ×3 IMPLANT
NDL PRECISIONGLIDE 27X1.5 (NEEDLE) IMPLANT
NDL SPNL 25GX3.5 QUINCKE BL (NEEDLE) ×1 IMPLANT
NEEDLE PRECISIONGLIDE 27X1.5 (NEEDLE) IMPLANT
NEEDLE SPNL 25GX3.5 QUINCKE BL (NEEDLE) IMPLANT
NS IRRIG 1000ML POUR BTL (IV SOLUTION) ×3 IMPLANT
PAD ARMBOARD 7.5X6 YLW CONV (MISCELLANEOUS) ×6 IMPLANT
PAD DRESSING TELFA 3X8 NADH (GAUZE/BANDAGES/DRESSINGS) ×1 IMPLANT
PATTIES SURGICAL .5 X1 (DISPOSABLE) ×2 IMPLANT
SOL ANTI FOG 6CC (MISCELLANEOUS) ×1 IMPLANT
SOLUTION ANTI FOG 6CC (MISCELLANEOUS) ×2
SPECIMEN JAR SMALL (MISCELLANEOUS) ×1 IMPLANT
SYR 3ML LL SCALE MARK (SYRINGE) IMPLANT
SYR CONTROL 10ML LL (SYRINGE) IMPLANT
SYR TB 1ML LUER SLIP (SYRINGE) ×2 IMPLANT
TOWEL GREEN STERILE FF (TOWEL DISPOSABLE) ×3 IMPLANT
TUBE CONNECTING 12'X1/4 (SUCTIONS) ×1
TUBE CONNECTING 12X1/4 (SUCTIONS) ×2 IMPLANT
WATER STERILE IRR 1000ML POUR (IV SOLUTION) ×3 IMPLANT

## 2019-12-25 NOTE — Op Note (Signed)
OPERATIVE REPORT  DATE OF SURGERY: 12/25/2019  PATIENT:  Raymond Hess,  69 y.o. male  PRE-OPERATIVE DIAGNOSIS:  Esophageal Cancer, left vocal cord paralysis  POST-OPERATIVE DIAGNOSIS:  Esophageal Cancer, left vocal cord paralysis  PROCEDURE:  Procedure(s): MICROLARYNGOSCOPY BILATERAL VOCAL CORD AUGMENTATION & INJECTION OF PROLARYN  SURGEON:  Beckie Salts, MD  ASSISTANTS: none  ANESTHESIA:   General   EBL: Less than 5 ml  DRAINS: none  LOCAL MEDICATIONS USED:  None  SPECIMEN: None  COUNTS:  Correct  PROCEDURE DETAILS: The patient was taken to the operating room and placed on the operating table in the supine position. Following induction of general endotracheal anesthesia, the table was turned 90 and the patient was draped in a standard fashion.  A tooth protector was used throughout the case.  A Jako laryngoscope was entered into the oral cavity used to visualize the larynx and attached to the Mayo stand with the suspension apparatus.  The operating microscope was brought into view.  The larynx was inspected.  Larin plus was injected into both vocal cords, total of 0.15 cc was injected combined on both sides.  The cords bulk up nicely and this was felt to be adequate implantation to assist with local function.  Patient was then allowed to emerge from the general anesthetic and inspection of the cords revealed the right side moves with the left side did not.  Laryngoscope was then removed and the care of the patient was handed back to anesthesia where he was awakened extubated and transferred to recovery in stable condition.    PATIENT DISPOSITION:  To PACU, stable

## 2019-12-25 NOTE — Anesthesia Postprocedure Evaluation (Signed)
Anesthesia Post Note  Patient: Raymond Hess  Procedure(s) Performed: MICROLARYNGOSCOPY BILATERAL VOCAL CORD AUGMENTATION & INJECTION OF PROLARYN (Bilateral Throat)     Patient location during evaluation: PACU Anesthesia Type: General Level of consciousness: awake and alert, oriented and patient cooperative Pain management: pain level controlled Vital Signs Assessment: post-procedure vital signs reviewed and stable Respiratory status: spontaneous breathing, nonlabored ventilation and respiratory function stable Cardiovascular status: blood pressure returned to baseline and stable Postop Assessment: no apparent nausea or vomiting Anesthetic complications: no   No complications documented.  Last Vitals:  Vitals:   12/25/19 1232 12/25/19 1416  BP:    Pulse: (!) 109 (!) 110  Resp:  18  Temp:  36.8 C  SpO2:  94%    Last Pain:  Vitals:   12/25/19 1416  TempSrc:   PainSc: 0-No pain                 Pervis Hocking

## 2019-12-25 NOTE — Interval H&P Note (Signed)
History and Physical Interval Note:  12/25/2019 1:13 PM  Raymond Hess  has presented today for surgery, with the diagnosis of Esophageal Cancer.  The various methods of treatment have been discussed with the patient and family. After consideration of risks, benefits and other options for treatment, the patient has consented to  Procedure(s): MICROLARYNGOSCOPY BILATERAL VOCAL CORD AUGMENTATION & INJECTION OF HYDROXYAPATITE (Bilateral) as a surgical intervention.  The patient's history has been reviewed, patient examined, no change in status, stable for surgery.  I have reviewed the patient's chart and labs.  Questions were answered to the patient's satisfaction.     Izora Gala

## 2019-12-25 NOTE — Transfer of Care (Signed)
Immediate Anesthesia Transfer of Care Note  Patient: Raymond Hess  Procedure(s) Performed: MICROLARYNGOSCOPY BILATERAL VOCAL CORD AUGMENTATION & INJECTION OF PROLARYN (Bilateral Throat)  Patient Location: PACU  Anesthesia Type:General  Level of Consciousness: drowsy and patient cooperative  Airway & Oxygen Therapy: Patient Spontanous Breathing  Post-op Assessment: Report given to RN and Post -op Vital signs reviewed and stable  Post vital signs: Reviewed and stable  Last Vitals:  Vitals Value Taken Time  BP    Temp    Pulse    Resp    SpO2      Last Pain:  Vitals:   12/25/19 1221  TempSrc:   PainSc: 0-No pain      Patients Stated Pain Goal: 5 (80/88/11 0315)  Complications: No complications documented.

## 2019-12-25 NOTE — Anesthesia Procedure Notes (Signed)
Procedure Name: Intubation Date/Time: 12/25/2019 1:38 PM Performed by: Terrence Dupont, RN Pre-anesthesia Checklist: Patient identified, Emergency Drugs available, Suction available and Patient being monitored Patient Re-evaluated:Patient Re-evaluated prior to induction Oxygen Delivery Method: Circle system utilized Preoxygenation: Pre-oxygenation with 100% oxygen Induction Type: IV induction and Rapid sequence Laryngoscope Size: Mac and 4 Grade View: Grade I Tube type: Oral Tube size: 6.0 mm Number of attempts: 1 Airway Equipment and Method: Stylet Placement Confirmation: ETT inserted through vocal cords under direct vision,  positive ETCO2 and breath sounds checked- equal and bilateral Secured at: 22 cm Tube secured with: Tape Dental Injury: Teeth and Oropharynx as per pre-operative assessment

## 2019-12-25 NOTE — Anesthesia Preprocedure Evaluation (Addendum)
Anesthesia Evaluation  Patient identified by MRN, date of birth, ID band Patient awake    Reviewed: Allergy & Precautions, NPO status , Patient's Chart, lab work & pertinent test results  History of Anesthesia Complications (+) history of anesthetic complications (postop ileus)  Airway Mallampati: II  TM Distance: >3 FB Neck ROM: Limited    Dental  (+) Teeth Intact, Dental Advisory Given   Pulmonary asthma ,    Pulmonary exam normal breath sounds clear to auscultation       Cardiovascular negative cardio ROS Normal cardiovascular exam Rhythm:Regular Rate:Normal     Neuro/Psych negative neurological ROS  negative psych ROS   GI/Hepatic Neg liver ROS, Esophageal ca s/p esophagectomy at [facility] recently, now with vocal cord paralysis    Endo/Other  negative endocrine ROS  Renal/GU negative Renal ROS  negative genitourinary   Musculoskeletal negative musculoskeletal ROS (+)   Abdominal   Peds  Hematology  (+) Blood dyscrasia, anemia , hct 32.5, plt 306   Anesthesia Other Findings   Reproductive/Obstetrics negative OB ROS                            Anesthesia Physical Anesthesia Plan  ASA: III  Anesthesia Plan: General   Post-op Pain Management:    Induction: Intravenous, Rapid sequence and Cricoid pressure planned  PONV Risk Score and Plan: Ondansetron, Dexamethasone and Treatment may vary due to age or medical condition  Airway Management Planned: Oral ETT  Additional Equipment: None  Intra-op Plan:   Post-operative Plan: Extubation in OR  Informed Consent: I have reviewed the patients History and Physical, chart, labs and discussed the procedure including the risks, benefits and alternatives for the proposed anesthesia with the patient or authorized representative who has indicated his/her understanding and acceptance.     Dental advisory given and Consent reviewed with  POA  Plan Discussed with: CRNA  Anesthesia Plan Comments:       Anesthesia Quick Evaluation

## 2019-12-25 NOTE — Discharge Instructions (Signed)
Continue using feeding tube.  Try with small amounts of liquid and other foods that you think you can tolerate.

## 2019-12-26 ENCOUNTER — Ambulatory Visit: Payer: PPO | Admitting: Nurse Practitioner

## 2019-12-26 ENCOUNTER — Encounter (HOSPITAL_COMMUNITY): Payer: Self-pay | Admitting: Otolaryngology

## 2019-12-27 ENCOUNTER — Ambulatory Visit: Payer: PPO | Admitting: Nurse Practitioner

## 2019-12-27 DIAGNOSIS — J189 Pneumonia, unspecified organism: Secondary | ICD-10-CM | POA: Diagnosis not present

## 2019-12-27 DIAGNOSIS — R197 Diarrhea, unspecified: Secondary | ICD-10-CM | POA: Diagnosis not present

## 2019-12-27 DIAGNOSIS — C154 Malignant neoplasm of middle third of esophagus: Secondary | ICD-10-CM | POA: Diagnosis not present

## 2019-12-27 DIAGNOSIS — J90 Pleural effusion, not elsewhere classified: Secondary | ICD-10-CM | POA: Diagnosis not present

## 2019-12-27 DIAGNOSIS — Z923 Personal history of irradiation: Secondary | ICD-10-CM | POA: Diagnosis not present

## 2019-12-27 DIAGNOSIS — Z79899 Other long term (current) drug therapy: Secondary | ICD-10-CM | POA: Diagnosis not present

## 2019-12-27 DIAGNOSIS — I2510 Atherosclerotic heart disease of native coronary artery without angina pectoris: Secondary | ICD-10-CM | POA: Diagnosis not present

## 2019-12-27 DIAGNOSIS — I700 Atherosclerosis of aorta: Secondary | ICD-10-CM | POA: Diagnosis not present

## 2019-12-28 ENCOUNTER — Telehealth: Payer: Self-pay

## 2019-12-28 NOTE — Telephone Encounter (Signed)
Patient's wife calls just to give [place] an update.  Patient had vocal cord injection for vocal cord paralysis.  It did not work so there is a surgical intervention they are considering.  Also had follow up with Dr. Elenor Quinones on Monday.  He plans to do EGD on 11/22 to look at the anastomosis for a potential leak.  He had been placed on antibiotics which gave him watery diarrhea (5 lb weight loss since Monday).  Dr. Elenor Quinones has stopped the antibiotics and the diarrhea is subsiding.  She states she will let us know what they find from EGD.  This message has been forwarded to Dr. Benay Spice.

## 2019-12-31 ENCOUNTER — Other Ambulatory Visit (HOSPITAL_COMMUNITY)
Admission: RE | Admit: 2019-12-31 | Discharge: 2019-12-31 | Disposition: A | Discharge status: Home / Self Care | Payer: PPO | Source: Ambulatory Visit | Attending: Otolaryngology | Admitting: Otolaryngology

## 2019-12-31 ENCOUNTER — Other Ambulatory Visit (HOSPITAL_COMMUNITY): Payer: Self-pay

## 2019-12-31 DIAGNOSIS — Z79899 Other long term (current) drug therapy: Secondary | ICD-10-CM | POA: Diagnosis not present

## 2019-12-31 DIAGNOSIS — K2270 Barrett's esophagus without dysplasia: Secondary | ICD-10-CM | POA: Diagnosis not present

## 2019-12-31 DIAGNOSIS — Z20822 Contact with and (suspected) exposure to covid-19: Secondary | ICD-10-CM | POA: Insufficient documentation

## 2019-12-31 DIAGNOSIS — Z791 Long term (current) use of non-steroidal anti-inflammatories (NSAID): Secondary | ICD-10-CM | POA: Diagnosis not present

## 2019-12-31 DIAGNOSIS — K219 Gastro-esophageal reflux disease without esophagitis: Secondary | ICD-10-CM | POA: Diagnosis not present

## 2019-12-31 DIAGNOSIS — R1310 Dysphagia, unspecified: Secondary | ICD-10-CM | POA: Diagnosis not present

## 2019-12-31 DIAGNOSIS — E871 Hypo-osmolality and hyponatremia: Secondary | ICD-10-CM | POA: Diagnosis not present

## 2019-12-31 DIAGNOSIS — Z8501 Personal history of malignant neoplasm of esophagus: Secondary | ICD-10-CM | POA: Diagnosis not present

## 2019-12-31 DIAGNOSIS — Z85828 Personal history of other malignant neoplasm of skin: Secondary | ICD-10-CM | POA: Diagnosis not present

## 2019-12-31 DIAGNOSIS — Z923 Personal history of irradiation: Secondary | ICD-10-CM | POA: Diagnosis not present

## 2019-12-31 DIAGNOSIS — K9189 Other postprocedural complications and disorders of digestive system: Secondary | ICD-10-CM | POA: Diagnosis not present

## 2019-12-31 DIAGNOSIS — Z9049 Acquired absence of other specified parts of digestive tract: Secondary | ICD-10-CM | POA: Diagnosis not present

## 2019-12-31 DIAGNOSIS — Z9221 Personal history of antineoplastic chemotherapy: Secondary | ICD-10-CM | POA: Diagnosis not present

## 2019-12-31 DIAGNOSIS — Z01812 Encounter for preprocedural laboratory examination: Secondary | ICD-10-CM | POA: Diagnosis not present

## 2019-12-31 HISTORY — PX: OTHER SURGICAL HISTORY: SHX169

## 2019-12-31 HISTORY — PX: ESOPHAGEAL DILATION: SHX303

## 2019-12-31 LAB — SARS CORONAVIRUS 2 (TAT 6-24 HRS): SARS Coronavirus 2: NEGATIVE

## 2020-01-01 ENCOUNTER — Inpatient Hospital Stay: Payer: PPO | Admitting: Nurse Practitioner

## 2020-01-01 ENCOUNTER — Encounter (HOSPITAL_COMMUNITY): Payer: Self-pay | Admitting: Otolaryngology

## 2020-01-01 NOTE — Progress Notes (Addendum)
I spoke to Trinidad Curet, Mr. Pop's wife patient can only speak in a whisper. Mr. Stipp had an EGG with Esophageal dilation and placement of endoscopic sten at [facility] on 12/31/19,which patient tolerated well per Mrs. Preast.  Mrs. Marineau will start Mr. Farrelly tube feeding earlier than normal today, it will have to be stopped at midnight. Mr. Lia lost weight while on antibiotics- severe diarrhea. We discussed using small amount of er for flush if he needs pain or nausea medication.  I asked Mrs. Winiecki to hold all Vitamins aad Herbal products until after surgery

## 2020-01-01 NOTE — Anesthesia Preprocedure Evaluation (Addendum)
Anesthesia Evaluation  Patient identified by MRN, date of birth, ID band Patient awake    Reviewed: Allergy & Precautions, H&P , NPO status , Patient's Chart, lab work & pertinent test results  Airway Mallampati: II  TM Distance: >3 FB Neck ROM: Full    Dental no notable dental hx. (+) Teeth Intact, Dental Advisory Given   Pulmonary neg pulmonary ROS,    Pulmonary exam normal breath sounds clear to auscultation       Cardiovascular Exercise Tolerance: Good negative cardio ROS   Rhythm:Regular Rate:Normal     Neuro/Psych negative neurological ROS  negative psych ROS   GI/Hepatic negative GI ROS, Neg liver ROS,   Endo/Other  negative endocrine ROS  Renal/GU negative Renal ROS  negative genitourinary   Musculoskeletal   Abdominal   Peds  Hematology negative hematology ROS (+)   Anesthesia Other Findings   Reproductive/Obstetrics negative OB ROS                            Anesthesia Physical Anesthesia Plan  ASA: II  Anesthesia Plan: General   Post-op Pain Management:    Induction: Intravenous  PONV Risk Score and Plan: 3 and Ondansetron, Dexamethasone and Midazolam  Airway Management Planned: Oral ETT  Additional Equipment:   Intra-op Plan:   Post-operative Plan: Extubation in OR  Informed Consent: I have reviewed the patients History and Physical, chart, labs and discussed the procedure including the risks, benefits and alternatives for the proposed anesthesia with the patient or authorized representative who has indicated his/her understanding and acceptance.     Dental advisory given  Plan Discussed with: CRNA  Anesthesia Plan Comments:        Anesthesia Quick Evaluation

## 2020-01-01 NOTE — H&P (Signed)
HPI:   Raymond Hess is a 69 y.o. male who presents as a consult Patient.   Referring Provider: Electa Sniff,*  Chief complaint: Loss of voice.  HPI: History of esophageal cancer. He underwent chemotherapy preop then esophagectomy about 3 weeks ago at [facility]. Since the surgery he has had no voice and trouble swallowing. He is on thickened diet but minimal oral diet. Most of his nutrition is through feeding tube.  PMH/Meds/All/SocHx/FamHx/ROS:   Past Medical History:  Diagnosis Date  . Throat cancer [facility])   Past Surgical History:  Procedure Laterality Date  . CATARACT EXTRACTION  . dental implant  . ESOPHAGECTOMY   No family history of bleeding disorders, wound healing problems or difficulty with anesthesia.   Social History   Socioeconomic History  . Marital status: Unknown  Spouse name: Not on file  . Number of children: Not on file  . Years of education: Not on file  . Highest education level: Not on file  Occupational History  . Not on file  Tobacco Use  . Smoking status: Never Smoker  . Smokeless tobacco: Never Used  Vaping Use  . Vaping Use: Never used  Substance and Sexual Activity  . Alcohol use: Not on file  . Drug use: Not on file  . Sexual activity: Not on file  Other Topics Concern  . Not on file  Social History Narrative  . Not on file   Social Determinants of Health   Financial Resource Strain:  . Difficulty of Paying Living Expenses: Not on file  Food Insecurity:  . Worried About Charity fundraiser in the Last Year: Not on file  . Ran Out of Food in the Last Year: Not on file  Transportation Needs:  . Lack of Transportation (Medical): Not on file  . Lack of Transportation (Non-Medical): Not on file  Physical Activity:  . Days of Exercise per Week: Not on file  . Minutes of Exercise per Session: Not on file  Stress:  . Feeling of Stress : Not on file  Social Connections:  . Frequency of Communication with Friends and Family: Not on  file  . Frequency of Social Gatherings with Friends and Family: Not on file  . Attends Religious Services: Not on file  . Active Member of Clubs or Organizations: Not on file  . Attends Archivist Meetings: Not on file  . Marital Status: Not on file   Current Outpatient Medications:  . acetaminophen (TYLENOL) 650 MG CR tablet, Take 650 mg by mouth., Disp: , Rfl:  . docusate sodium (COLACE) 50 mg/5 mL liquid, Take 100 mg by mouth., Disp: , Rfl:  . nutritional supplements Liqd, by Jejunal Tube route. 85 ml/hour via J-tube from 4 pm to 10 am daily, Disp: , Rfl:  . ondansetron (ZOFRAN-ODT) 4 MG disintegrating tablet, , Disp: , Rfl:  . sennosides (SENOKOT) 8.8 mg/5 mL syrup, Take 10 mLs by mouth., Disp: , Rfl:  . simethicone (MYLICON,PHAZYME) 80 MG chewable tablet, Take 80 mg by mouth., Disp: , Rfl:  . traMADoL (ULTRAM) 50 mg tablet, , Disp: , Rfl:   A complete ROS was performed with pertinent positives/negatives noted in the HPI. The remainder of the ROS are negative.   Physical Exam:   BP 126/78  Pulse 104  Temp 97.7 F (36.5 C)  Ht 1.778 m (5\' 10" )  Wt 69.9 kg (154 lb)  BMI 22.10 kg/m   General: Healthy and alert, in no distress, breathing easily. Normal affect. In  a pleasant mood. Voice is severely breathy. Head: Normocephalic, atraumatic. No masses, or scars. Eyes: Pupils are equal, and reactive to light. Vision is grossly intact. No spontaneous or gaze nystagmus. Ears: Ear canals are clear. Tympanic membranes are intact, with normal landmarks and the middle ears are clear and healthy. Hearing: Grossly diminished, he has bilateral hearing aids. Nose: Nasal cavities are clear with healthy mucosa, no polyps or exudate. Airways are patent. Face: No masses or scars, facial nerve function is symmetric. Oral Cavity: No mucosal abnormalities are noted. Tongue with normal mobility. Dentition appears healthy. Oropharynx: Tonsils are symmetric. There are no mucosal masses  identified. Tongue base appears normal and healthy. Larynx/Hypopharynx: Difficult to examine thoroughly using a mirror. Chest: Deferred Neck: No palpable masses, no cervical adenopathy, no thyroid nodules or enlargement. Neuro: Cranial nerves II-XII with normal function. Balance: Normal gate. Other findings: none.  Independent Review of Additional Tests or Records:  none  Procedures:  Procedure note: Flexible fiberoptic laryngoscopy  Details of the procedure were explained to the patient and all questions were answered.   Procedure:   After anesthetizing the nasal cavity with topical lidocaine and oxymetazoline, the flexible endoscope was introduced and passed through the nasal cavity into the nasopharynx. The scope was then advanced to the level of the oropharynx, then the hypopharynx and larynx. Scope number 20 was used.  Findings:   The posterior soft palate, uvula, tongue base and vallecula were visualized and appeared healthy without mucosal masses or lesions. The epiglottis, aryepiglottic folds, hypopharynx, supraglottis, glottis were visualized and appeared healthy without mucosal masses or lesions. Vocal fold mobility was asymmetric with significant weakening possible complete paralysis on the left, very difficult to tell for sure.   Additional findings: Mild pooling of secretions in the vallecula and piriform sinuses  The scope was withdrawn from the nose. He tolerated the procedure well.   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 Given that and the frustration he is experiencing with his severely weakened voice I would recommend surgical intervention sooner than later. We discussed 2 options 1 being endoscopic bilateral vocal cord injection with filler material.  The second would be left medialization laryngoplasty. The second operation is longer and requires an overnight stay. It tends to have better effects and more long-lasting. He would like to try the quicker and easier thing first and then we can always reserve the second procedure after that if we need to.

## 2020-01-02 ENCOUNTER — Observation Stay (HOSPITAL_COMMUNITY)
Admission: RE | Admit: 2020-01-02 | Discharge: 2020-01-03 | Disposition: A | Discharge status: Home / Self Care | Payer: PPO | Attending: Otolaryngology | Admitting: Otolaryngology

## 2020-01-02 ENCOUNTER — Ambulatory Visit (HOSPITAL_COMMUNITY): Payer: PPO | Admitting: Anesthesiology

## 2020-01-02 ENCOUNTER — Encounter (HOSPITAL_COMMUNITY)
Admission: RE | Disposition: A | Discharge status: Home / Self Care | Payer: Self-pay | Source: Home / Self Care | Attending: Otolaryngology

## 2020-01-02 ENCOUNTER — Encounter (HOSPITAL_COMMUNITY): Payer: Self-pay | Admitting: Otolaryngology

## 2020-01-02 DIAGNOSIS — C159 Malignant neoplasm of esophagus, unspecified: Secondary | ICD-10-CM | POA: Insufficient documentation

## 2020-01-02 DIAGNOSIS — C154 Malignant neoplasm of middle third of esophagus: Secondary | ICD-10-CM | POA: Diagnosis not present

## 2020-01-02 DIAGNOSIS — J3800 Paralysis of vocal cords and larynx, unspecified: Principal | ICD-10-CM | POA: Insufficient documentation

## 2020-01-02 HISTORY — DX: Other specified postprocedural states: Z98.890

## 2020-01-02 HISTORY — PX: LARYNGOSCOPY: SHX5203

## 2020-01-02 HISTORY — PX: LARYNGOPLASTY: SHX282

## 2020-01-02 SURGERY — LARYNGOPLASTY
Anesthesia: General | Site: Throat | Laterality: Left

## 2020-01-02 MED ORDER — DEXAMETHASONE SODIUM PHOSPHATE 10 MG/ML IJ SOLN
INTRAMUSCULAR | Status: AC
  Filled 2020-01-02: qty 1

## 2020-01-02 MED ORDER — ONDANSETRON HCL 4 MG/2ML IJ SOLN
INTRAMUSCULAR | Status: AC
  Filled 2020-01-02: qty 2

## 2020-01-02 MED ORDER — PHENYLEPHRINE 40 MCG/ML (10ML) SYRINGE FOR IV PUSH (FOR BLOOD PRESSURE SUPPORT)
PREFILLED_SYRINGE | INTRAVENOUS | Status: DC | PRN
  Administered 2020-01-02: 120 ug via INTRAVENOUS

## 2020-01-02 MED ORDER — MIDAZOLAM HCL 2 MG/2ML IJ SOLN
INTRAMUSCULAR | Status: DC | PRN
  Administered 2020-01-02: 2 mg via INTRAVENOUS

## 2020-01-02 MED ORDER — OXYMETAZOLINE HCL 0.05 % NA SOLN
NASAL | Status: DC | PRN
  Administered 2020-01-02: 1

## 2020-01-02 MED ORDER — LIDOCAINE-EPINEPHRINE 1 %-1:100000 IJ SOLN
INTRAMUSCULAR | Status: AC
  Filled 2020-01-02: qty 1

## 2020-01-02 MED ORDER — ACETAMINOPHEN 160 MG/5ML PO SOLN
500.0000 mg | ORAL | Status: DC | PRN
  Administered 2020-01-02: 500 mg via ORAL
  Filled 2020-01-02: qty 20.3

## 2020-01-02 MED ORDER — SIMETHICONE 80 MG PO CHEW: 80.0000 mg | CHEWABLE_TABLET | Freq: Four times a day (QID) | ORAL | Status: DC | PRN

## 2020-01-02 MED ORDER — CEFAZOLIN SODIUM-DEXTROSE 2-3 GM-%(50ML) IV SOLR
INTRAVENOUS | Status: DC | PRN
  Administered 2020-01-02: 2 g via INTRAVENOUS

## 2020-01-02 MED ORDER — CHLORHEXIDINE GLUCONATE 0.12 % MT SOLN: 15.0000 mL | Freq: Once | OROMUCOSAL | Status: AC

## 2020-01-02 MED ORDER — KATE FARMS PED PEPTIDE 1.5 PO LIQD
1.0000 | Freq: Every day | ORAL | Status: AC
  Administered 2020-01-02: 1 via JEJUNOSTOMY

## 2020-01-02 MED ORDER — PROPOFOL 10 MG/ML IV BOLUS
INTRAVENOUS | Status: AC
  Filled 2020-01-02: qty 40

## 2020-01-02 MED ORDER — ORAL CARE MOUTH RINSE: 15.0000 mL | Freq: Once | OROMUCOSAL | Status: AC

## 2020-01-02 MED ORDER — ALBUMIN HUMAN 5 % IV SOLN: INTRAVENOUS | Status: DC | PRN

## 2020-01-02 MED ORDER — DIPHENHYDRAMINE HCL 50 MG/ML IJ SOLN
INTRAMUSCULAR | Status: AC
  Filled 2020-01-02: qty 1

## 2020-01-02 MED ORDER — FENTANYL CITRATE (PF) 250 MCG/5ML IJ SOLN
INTRAMUSCULAR | Status: AC
  Filled 2020-01-02: qty 5

## 2020-01-02 MED ORDER — ACETAMINOPHEN 500 MG PO TABS
1000.0000 mg | ORAL_TABLET | Freq: Once | ORAL | Status: DC
  Filled 2020-01-02: qty 2

## 2020-01-02 MED ORDER — DIPHENHYDRAMINE HCL 50 MG/ML IJ SOLN
25.0000 mg | Freq: Once | INTRAMUSCULAR | Status: AC
  Administered 2020-01-02: 25 mg via INTRAVENOUS

## 2020-01-02 MED ORDER — PHENYLEPHRINE HCL-NACL 10-0.9 MG/250ML-% IV SOLN
INTRAVENOUS | Status: DC | PRN
  Administered 2020-01-02: 40 ug/min via INTRAVENOUS

## 2020-01-02 MED ORDER — SENNOSIDES 8.8 MG/5ML PO SYRP
5.0000 mL | ORAL_SOLUTION | Freq: Three times a day (TID) | ORAL | Status: DC | PRN
  Filled 2020-01-02: qty 5

## 2020-01-02 MED ORDER — OXYMETAZOLINE HCL 0.05 % NA SOLN
NASAL | Status: AC
  Filled 2020-01-02: qty 30

## 2020-01-02 MED ORDER — MAGIC MOUTHWASH
10.0000 mL | Freq: Four times a day (QID) | ORAL | Status: DC
  Administered 2020-01-02 – 2020-01-03 (×3): 10 mL via ORAL
  Filled 2020-01-02 (×5): qty 10

## 2020-01-02 MED ORDER — LIDOCAINE HCL (PF) 2 % IJ SOLN
INTRAMUSCULAR | Status: AC
  Filled 2020-01-02: qty 5

## 2020-01-02 MED ORDER — EPHEDRINE 5 MG/ML INJ
INTRAVENOUS | Status: AC
  Filled 2020-01-02: qty 10

## 2020-01-02 MED ORDER — PROPOFOL 10 MG/ML IV BOLUS
INTRAVENOUS | Status: DC | PRN
  Administered 2020-01-02: 160 mg via INTRAVENOUS

## 2020-01-02 MED ORDER — DEXAMETHASONE SODIUM PHOSPHATE 10 MG/ML IJ SOLN
INTRAMUSCULAR | Status: DC | PRN
  Administered 2020-01-02: 10 mg via INTRAVENOUS

## 2020-01-02 MED ORDER — GUAIFENESIN 100 MG/5ML PO SOLN
200.0000 mg | ORAL | Status: DC | PRN
  Filled 2020-01-02: qty 10

## 2020-01-02 MED ORDER — FENTANYL CITRATE (PF) 100 MCG/2ML IJ SOLN: 25.0000 ug | INTRAMUSCULAR | Status: DC | PRN

## 2020-01-02 MED ORDER — DEXTROSE-NACL 5-0.9 % IV SOLN: INTRAVENOUS | Status: DC

## 2020-01-02 MED ORDER — FENTANYL CITRATE (PF) 250 MCG/5ML IJ SOLN
INTRAMUSCULAR | Status: DC | PRN
  Administered 2020-01-02 (×4): 25 ug via INTRAVENOUS

## 2020-01-02 MED ORDER — MIDAZOLAM HCL 2 MG/2ML IJ SOLN
INTRAMUSCULAR | Status: AC
  Filled 2020-01-02: qty 2

## 2020-01-02 MED ORDER — TRAMADOL HCL 50 MG PO TABS
50.0000 mg | ORAL_TABLET | Freq: Four times a day (QID) | ORAL | Status: DC | PRN
  Administered 2020-01-03: 50 mg via ORAL
  Filled 2020-01-02: qty 1

## 2020-01-02 MED ORDER — EPHEDRINE SULFATE-NACL 50-0.9 MG/10ML-% IV SOSY
PREFILLED_SYRINGE | INTRAVENOUS | Status: DC | PRN
  Administered 2020-01-02 (×2): 5 mg via INTRAVENOUS

## 2020-01-02 MED ORDER — ONDANSETRON HCL 4 MG/2ML IJ SOLN
INTRAMUSCULAR | Status: DC | PRN
  Administered 2020-01-02: 4 mg via INTRAVENOUS

## 2020-01-02 MED ORDER — LIDOCAINE 2% (20 MG/ML) 5 ML SYRINGE
INTRAMUSCULAR | Status: DC | PRN
  Administered 2020-01-02: 60 mg via INTRAVENOUS

## 2020-01-02 MED ORDER — KATE FARMS PED PEPTIDE 1.5 PO LIQD: 80.0000 mL/h | Freq: Every day | ORAL | Status: DC

## 2020-01-02 MED ORDER — CHLORHEXIDINE GLUCONATE 0.12 % MT SOLN
OROMUCOSAL | Status: AC
  Administered 2020-01-02: 15 mL via OROMUCOSAL
  Filled 2020-01-02: qty 15

## 2020-01-02 MED ORDER — 0.9 % SODIUM CHLORIDE (POUR BTL) OPTIME
TOPICAL | Status: DC | PRN
  Administered 2020-01-02: 1000 mL

## 2020-01-02 MED ORDER — ACETAMINOPHEN 10 MG/ML IV SOLN
INTRAVENOUS | Status: DC | PRN
  Administered 2020-01-02: 1000 mg via INTRAVENOUS

## 2020-01-02 MED ORDER — ACETAMINOPHEN 10 MG/ML IV SOLN
INTRAVENOUS | Status: AC
  Filled 2020-01-02: qty 100

## 2020-01-02 MED ORDER — ONDANSETRON HCL 4 MG PO TABS: 4.0000 mg | ORAL_TABLET | Freq: Three times a day (TID) | ORAL | Status: DC | PRN

## 2020-01-02 MED ORDER — PROCHLORPERAZINE MALEATE 10 MG PO TABS
10.0000 mg | ORAL_TABLET | Freq: Four times a day (QID) | ORAL | Status: DC | PRN
  Filled 2020-01-02: qty 1

## 2020-01-02 MED ORDER — LACTATED RINGERS IV SOLN: INTRAVENOUS | Status: DC

## 2020-01-02 MED ORDER — LIDOCAINE-EPINEPHRINE 1 %-1:100000 IJ SOLN
INTRAMUSCULAR | Status: DC | PRN
  Administered 2020-01-02: 1.5 mL

## 2020-01-02 SURGICAL SUPPLY — 44 items
"silicone block 1.91cm x1.91 cm " IMPLANT
ADH SKN CLS APL DERMABOND .7 (GAUZE/BANDAGES/DRESSINGS) ×1
BAND INSRT 18 STRL LF DISP RB (MISCELLANEOUS) ×2
BAND RUBBER #18 3X1/16 STRL (MISCELLANEOUS) ×5 IMPLANT
BLADE SURG 10 STRL SS (BLADE) ×3 IMPLANT
BLADE SURG 15 STRL LF DISP TIS (BLADE) IMPLANT
BLADE SURG 15 STRL SS (BLADE) ×3
BLOCK SILICONE 1.91X1.91 (MISCELLANEOUS) ×2 IMPLANT
BUR CROSS CUT FISSURE 1.6 (BURR) ×1 IMPLANT
BUR CROSS CUT FISSURE 1.6MM (BURR)
CANISTER SUCT 3000ML PPV (MISCELLANEOUS) ×3 IMPLANT
CLEANER TIP ELECTROSURG 2X2 (MISCELLANEOUS) ×3 IMPLANT
CORD BIPOLAR FORCEPS 12FT (ELECTRODE) ×3 IMPLANT
COVER MAYO STAND STRL (DRAPES) ×2 IMPLANT
COVER SURGICAL LIGHT HANDLE (MISCELLANEOUS) ×3 IMPLANT
COVER WAND RF STERILE (DRAPES) ×3 IMPLANT
DERMABOND ADVANCED (GAUZE/BANDAGES/DRESSINGS) ×2
DERMABOND ADVANCED .7 DNX12 (GAUZE/BANDAGES/DRESSINGS) ×1 IMPLANT
DRAPE HALF SHEET 40X57 (DRAPES) ×2 IMPLANT
ELECT COATED BLADE 2.86 ST (ELECTRODE) ×3 IMPLANT
ELECT REM PT RETURN 9FT ADLT (ELECTROSURGICAL) ×3
ELECTRODE REM PT RTRN 9FT ADLT (ELECTROSURGICAL) ×1 IMPLANT
FORCEPS BIPOLAR SPETZLER 8 1.0 (NEUROSURGERY SUPPLIES) ×2 IMPLANT
GAUZE SPONGE 2X2 8PLY STRL LF (GAUZE/BANDAGES/DRESSINGS) IMPLANT
GAUZE SPONGE 4X4 12PLY STRL (GAUZE/BANDAGES/DRESSINGS) IMPLANT
GLOVE ECLIPSE 7.5 STRL STRAW (GLOVE) ×3 IMPLANT
GOWN STRL REUS W/ TWL LRG LVL3 (GOWN DISPOSABLE) ×2 IMPLANT
GOWN STRL REUS W/TWL LRG LVL3 (GOWN DISPOSABLE) ×6
KIT BASIN OR (CUSTOM PROCEDURE TRAY) ×3 IMPLANT
KIT TURNOVER KIT B (KITS) ×3 IMPLANT
NDL PRECISIONGLIDE 27X1.5 (NEEDLE) ×1 IMPLANT
NEEDLE PRECISIONGLIDE 27X1.5 (NEEDLE) ×3 IMPLANT
NS IRRIG 1000ML POUR BTL (IV SOLUTION) ×3 IMPLANT
PAD ARMBOARD 7.5X6 YLW CONV (MISCELLANEOUS) ×6 IMPLANT
PATTIES SURGICAL .5 X1 (DISPOSABLE) ×2 IMPLANT
PENCIL FOOT CONTROL (ELECTRODE) ×3 IMPLANT
SPONGE GAUZE 2X2 STER 10/PKG (GAUZE/BANDAGES/DRESSINGS) ×2
SUT CHROMIC 3 0 PS 2 (SUTURE) ×3 IMPLANT
SUT CHROMIC 3 0 SH 27 (SUTURE) ×3 IMPLANT
SUT VIC AB 3-0 SH 27 (SUTURE)
SUT VIC AB 3-0 SH 27XBRD (SUTURE) ×1 IMPLANT
TOWEL GREEN STERILE FF (TOWEL DISPOSABLE) ×3 IMPLANT
TRAY ENT MC OR (CUSTOM PROCEDURE TRAY) ×3 IMPLANT
silicone block 1.91cm x1.91 cm ×1 IMPLANT

## 2020-01-02 NOTE — Progress Notes (Signed)
ENT Post Operative Note  Subjective: Patient seen and examined, wife at bedside.  He reports doing well since surgery, pain controlled.  He is tolerating a soft diet without any difficulty.  He denies nausea, emesis.   Vitals:   01/02/20 1120 01/02/20 1520  BP: 135/85 130/74  Pulse: (!) 115 (!) 114  Resp: 16 18  Temp: 98.2 F (36.8 C)   SpO2: 98% 98%     OBJECTIVE  Gen: alert, cooperative, appropriate Head/ENT: EOMI, neck supple, mucus membranes moist and pink, conjunctiva clear, neck incision clean, dry, intact with Penrose drain exiting, serosanguineous drainage on dressing Respiratory: Voice intact, hoarse, non-labored breathing, no accessory muscle use, normal HR, good O2 saturations   ASSESS/ PLAN  Raymond Hess is a 69 y.o. male who is POD 0 from left vocal cord medialization laryngoplasty and laryngoscopy with Dr. Constance Holster.  Patient progressing well in acute postoperative phase. Continue soft diet, pain control. Likely discharge tomorrow pending continued clinical stability   Jason Coop, [facility] Cell: [phone]

## 2020-01-02 NOTE — Anesthesia Postprocedure Evaluation (Signed)
Anesthesia Post Note  Patient: CALE BETHARD  Procedure(s) Performed: LEFT VOCAL CORD MEDIALIZATION LARYNGOPLASTY (Left Throat) LARYNGOSCOPY (Left Throat)     Patient location during evaluation: PACU Anesthesia Type: General Level of consciousness: awake and alert Pain management: pain level controlled Vital Signs Assessment: post-procedure vital signs reviewed and stable Respiratory status: spontaneous breathing, nonlabored ventilation and respiratory function stable Cardiovascular status: blood pressure returned to baseline and stable Postop Assessment: no apparent nausea or vomiting Anesthetic complications: no   No complications documented.  Last Vitals:  Vitals:   01/02/20 1000 01/02/20 1030  BP: 120/82 122/81  Pulse: (!) 113 (!) 114  Resp: 17 19  Temp:    SpO2: 97% 94%    Last Pain:  Vitals:   01/02/20 1030  TempSrc:   PainSc: 0-No pain                 Emberlyn Burlison,W. EDMOND

## 2020-01-02 NOTE — Progress Notes (Signed)
   01/02/20 1120  Assess: MEWS Score  Temp 98.2 F (36.8 C)  BP 135/85  Pulse Rate (!) 115  Resp 16  SpO2 98 %  O2 Device Room Air  Assess: MEWS Score  MEWS Temp 0  MEWS Systolic 0  MEWS Pulse 2  MEWS RR 0  MEWS LOC 0  MEWS Score 2  MEWS Score Color Yellow  Assess: if the MEWS score is Yellow or Red  Were vital signs taken at a resting state? Yes  Focused Assessment No change from prior assessment  Early Detection of Sepsis Score *See Row Information* Low  MEWS guidelines implemented *See Row Information* No, previously yellow, continue vital signs every 4 hours  Treat  MEWS Interventions Administered scheduled meds/treatments  Pain Scale 0-10  Pain Score 0  Pain Location Throat  Pain Orientation Anterior  Notify: Charge Nurse/RN  Name of Charge Nurse/RN Notified Esperanza Richters, Rn  Date Charge Nurse/RN Notified 01/02/20  Time Charge Nurse/RN Notified 1120  Notify: Provider  Provider Name/Title Dr. Constance Holster  Date Provider Notified 01/02/20  Time Provider Notified 1120  Notification Type Call  Notification Reason Other (Comment) (Reviewing orders and pulse rate)  Response No new orders  Date of Provider Response 01/02/20  Time of Provider Response 1120  Document  Progress note created (see row info) Yes

## 2020-01-02 NOTE — Progress Notes (Signed)
Pt admitted to 6N31 from PACU s/p Dr. Constance Holster  Vocal cord repair. Wife at bedside and knows all about his feeding and brought the formula and pump for him.  She will handle this as he has an extreme allergy to any meat products. Dr. Constance Holster called to verify feeding and orders.  Pt can have what he desires of a soft diet. Pt pulse is 115 and Dr. Constance Holster was notified of this, pt has been in this range. Will continue vital signs every 4 hours.

## 2020-01-02 NOTE — Op Note (Signed)
OPERATIVE REPORT  DATE OF SURGERY: 01/02/2020  PATIENT:  Raymond Hess,  69 y.o. male  PRE-OPERATIVE DIAGNOSIS:  Esophageal Cancer, vocal cord paralysis  POST-OPERATIVE DIAGNOSIS:  Esophageal Cancer, vocal cord paralysis  PROCEDURE:  Procedure(s): LEFT VOCAL CORD MEDIALIZATION LARYNGOPLASTY LARYNGOSCOPY  SURGEON:  Beckie Salts, MD  ASSISTANTS: None  ANESTHESIA:   General   EBL: 20 ml  DRAINS: Sterile rubber band  LOCAL MEDICATIONS USED: 1% Xylocaine with epinephrine  SPECIMEN:  none  COUNTS:  Correct  PROCEDURE DETAILS: The patient was taken to the operating room and placed on the operating table in the supine position. Following induction of general endotracheal anesthesia, using a laryngeal mask airway the patient was prepped and draped in standard fashion.  The Mayo stand was placed at the head of the table overlying the patient's head.  Flexible fiberoptic scope was passed through the accessory port of the laryngeal mask airway and used to visualize the larynx throughout the procedure.  It was set up on the Mayo stand and covered under the drapes to keep sterile.  Neck was prepped and draped.  The laryngeal cartilage was identified and 1/2 cm incision was outlined transversely at the midportion of the thyroid cartilage to the left of midline.  Electrocautery was used to incise the skin after infiltrating with local anesthetic solution.  The strap muscles were reflected laterally and the cartilage was exposed.  Perichondrium was incised anteriorly and elevated reflected posteriorly down the left side.  A 15 scalpel and a #10 scalpel along with Editor, commissioning used to open the window in the laryngeal cartilage.  It was about 13 mm in length, about 3 mm above the inferior margin and about 4 5 mm posterior to the midline.  Perichondrium was elevated using a Surveyor, quantity while visualizing the movement of the cord.  A Silastic block was then carved to a wedge  shaped that gave appropriate medialization especially posteriorly.  The positioning look good in the implant was nice and stable in place.  The wound was irrigated with saline.  Bipolar cautery was used for completion of hemostasis.  A rubber band drain was left in the depths of the wound and exited through the skin.  The closure was accomplished using interrupted 3-0 chromic's on the strap muscles, the deep layer and the subcuticular closure.  Dermabond was used on the skin and a dressing was applied.  Patient was awakened extubated and transferred to recovery in stable condition.    PATIENT DISPOSITION:  To PACU, stable

## 2020-01-02 NOTE — Progress Notes (Signed)
No labs needed per Dr. Marin Comment.

## 2020-01-02 NOTE — OR Nursing (Signed)
Pt is awake,alert and oriented.Pt and/or family verbalized understanding of poc and discharge instructions. Reviewed admission and on going care with receiving RN. Pt is in NAD at this time and is ready to be transferred to floor. Will con't to monitor until pt is transferred. Belongings on bed with patient  

## 2020-01-02 NOTE — Interval H&P Note (Signed)
History and Physical Interval Note:  01/02/2020 7:24 AM  Raymond Hess  has presented today for surgery, with the diagnosis of Esophageal Cancer.  The various methods of treatment have been discussed with the patient and family. After consideration of risks, benefits and other options for treatment, the patient has consented to  Procedure(s): LEFT VOCAL CORD MEDIALIZATION LARYNGOPLASTY (Left) as a surgical intervention.  The patient's history has been reviewed, patient examined, no change in status, stable for surgery.  I have reviewed the patient's chart and labs.  Questions were answered to the patient's satisfaction.     Izora Gala

## 2020-01-02 NOTE — Transfer of Care (Signed)
Immediate Anesthesia Transfer of Care Note  Patient: Raymond Hess  Procedure(s) Performed: LEFT VOCAL CORD MEDIALIZATION LARYNGOPLASTY (Left Throat) LARYNGOSCOPY (Left Throat)  Patient Location: PACU  Anesthesia Type:General  Level of Consciousness: awake, alert  and patient cooperative  Airway & Oxygen Therapy: Patient Spontanous Breathing  Post-op Assessment: Report given to RN and Post -op Vital signs reviewed and stable  Post vital signs: Reviewed and stable  Last Vitals:  Vitals Value Taken Time  BP    Temp    Pulse 115 01/02/20 0920  Resp 13 01/02/20 0920  SpO2 100 % 01/02/20 0920  Vitals shown include unvalidated device data.  Last Pain:  Vitals:   01/02/20 0654  TempSrc:   PainSc: 5       Patients Stated Pain Goal: 7 (99/35/70 1779)  Complications: No complications documented.

## 2020-01-02 NOTE — Anesthesia Procedure Notes (Signed)
Procedure Name: LMA Insertion Date/Time: 01/02/2020 7:48 AM Performed by: Thelma Comp, CRNA Pre-anesthesia Checklist: Patient identified, Emergency Drugs available, Suction available and Patient being monitored Patient Re-evaluated:Patient Re-evaluated prior to induction Oxygen Delivery Method: Circle System Utilized Preoxygenation: Pre-oxygenation with 100% oxygen Induction Type: IV induction Ventilation: Mask ventilation without difficulty LMA: LMA inserted LMA Size: 4.0 Number of attempts: 1 Airway Equipment and Method: Bite block Placement Confirmation: positive ETCO2 Tube secured with: Tape Dental Injury: Teeth and Oropharynx as per pre-operative assessment

## 2020-01-03 ENCOUNTER — Encounter (HOSPITAL_COMMUNITY): Payer: Self-pay | Admitting: Otolaryngology

## 2020-01-03 DIAGNOSIS — J3800 Paralysis of vocal cords and larynx, unspecified: Secondary | ICD-10-CM | POA: Diagnosis not present

## 2020-01-03 NOTE — Discharge Summary (Signed)
Physician Discharge Summary  Patient ID: Raymond Hess MRN: [id number] DOB/AGE: 07-14-1950 69 y.o.  Admit date: 01/02/2020 Discharge date: 01/03/2020  Admission Diagnoses: Vocal cord paralysis, esophageal cancer  Discharge Diagnoses:  Active Problems:   Vocal cord paralysis Esophageal cancer  Discharged Condition: good  Hospital Course: 69 year old male with esophageal cancer developed hoarseness due to left vocal cord paralysis and presented to the hospital for surgical management.  See operative note.  He was observed overnight with a rubber band drain in place and did well.  He is stable for discharge on POD 1 after drain removal.  Consults: None  Significant Diagnostic Studies: None  Treatments: surgery: Left medialization laryngoplasty  Discharge Exam: Blood pressure (!) 143/83, pulse (!) 103, temperature 97.6 F (36.4 C), resp. rate 18, height 5\' 10"  (1.778 m), weight 68.9 kg, SpO2 99 %. General appearance: alert, cooperative and no distress Neck: incision clean, rubber band removed, voice moderately hoarse but not breathy  Disposition: Discharge disposition: 01-Home or Self Care       Discharge Instructions    Diet - low sodium heart healthy   Complete by: As directed    Discharge instructions   Complete by: As directed    Resume normal diet.  Dry gauze on wound if there is any drainage.  OK to shower incision, gently pat dry.   Increase activity slowly   Complete by: As directed    No wound care   Complete by: As directed      Allergies as of 01/03/2020      Reactions   Alpha-gal Hives   Meat Extract Hives   No meat or meat products   Oxycodone Hcl    Felt loopy      Medication List    TAKE these medications   acetaminophen 160 MG/5ML elixir Commonly known as: TYLENOL Take 15 mg/kg by mouth every 4 (four) hours as needed for fever.   amoxicillin-clavulanate 400-57 MG/5ML suspension Commonly known as: AUGMENTIN 400 mg by Per J Tube route 2  (two) times daily. 10 ml- mixes with 10 ml to dilute it more.   fluconazole 40 MG/ML suspension Commonly known as: DIFLUCAN Place 400 mg into feeding tube daily.   guaiFENesin 100 MG/5ML liquid Commonly known as: ROBITUSSIN Take 200 mg by mouth every 4 (four) hours as needed for cough.   Anda [employer] Ped Peptide 1.5 Liqd by Jejunal Tube route. 85 ml/hour via J-tube from 4 pm to 10 am daily   magic mouthwash Soln Take 10 mLs by mouth every 6 (six) hours.   ondansetron 4 MG tablet Commonly known as: ZOFRAN Take 4 mg by mouth every 8 (eight) hours as needed for nausea or vomiting. dissolvable   prochlorperazine 10 MG tablet Commonly known as: COMPAZINE Take 10 mg by mouth every 6 (six) hours as needed for nausea or vomiting.   sennosides 8.8 MG/5ML syrup Commonly known as: SENOKOT Take 5 mLs by mouth 3 (three) times daily as needed for mild constipation.   simethicone 125 MG chewable tablet Commonly known as: MYLICON Chew 676 mg by mouth every 6 (six) hours as needed for flatulence.   traMADol 50 MG tablet Commonly known as: ULTRAM Take 1 tablet (50 mg total) by mouth every 6 (six) hours as needed for moderate pain.       Follow-up Information    Izora Gala, MD. Schedule an appointment as soon as possible for a visit in 1 week(s).   Specialty: Otolaryngology Contact information: [phone]  [employer] [address] [place] [address] [phone]               Signed: Melida Quitter 01/03/2020, 6:45 AM

## 2020-01-03 NOTE — Plan of Care (Signed)
D/c home with wife VSS. Pain controled.  Breathing regular and unlabored on room air.  Incision dry.  Dry gauze placed.  Instructions given and explained with understanding by pt and wife.

## 2020-01-08 ENCOUNTER — Inpatient Hospital Stay: Payer: PPO

## 2020-01-08 ENCOUNTER — Other Ambulatory Visit: Payer: Self-pay

## 2020-01-08 ENCOUNTER — Inpatient Hospital Stay (HOSPITAL_BASED_OUTPATIENT_CLINIC_OR_DEPARTMENT_OTHER): Payer: PPO | Admitting: Oncology

## 2020-01-08 VITALS — BP 132/72 | HR 112 | Temp 98.2°F | Resp 17 | Ht 70.0 in | Wt 150.9 lb

## 2020-01-08 DIAGNOSIS — R490 Dysphonia: Secondary | ICD-10-CM | POA: Diagnosis not present

## 2020-01-08 DIAGNOSIS — C154 Malignant neoplasm of middle third of esophagus: Secondary | ICD-10-CM | POA: Diagnosis not present

## 2020-01-08 DIAGNOSIS — J3800 Paralysis of vocal cords and larynx, unspecified: Secondary | ICD-10-CM | POA: Diagnosis not present

## 2020-01-08 NOTE — Progress Notes (Signed)
[facility] OFFICE PROGRESS NOTE   Diagnosis: Esophagus cancer  INTERVAL HISTORY:   Raymond Hess returns for a scheduled visit.  He was diagnosed with an esophageal leak when admitted at [facility].  He underwent an upper endoscopy on 12/31/2019.  The anastomosis was mildly narrowed at 18 cm with a small lateral defect.  An esophageal stent was placed.  He continues tube feedings and is tolerating a partial diet by mouth.  Raymond Hess continues to have hoarseness and a cough.  He saw Dr. Constance Holster and was confirmed to have left vocal cord paralysis.  He underwent left vocal cord medialization on 01/02/2020.  He has been referred to speech therapy at [facility].  Raymond Hess continues to have a nonproductive cough.  No fever.  Objective:  Vital signs in last 24 hours:  Blood pressure 132/72, pulse (!) 112, temperature 98.2 F (36.8 C), temperature source Tympanic, resp. rate 17, height 5\' 10"  (1.778 m), weight 150 lb 14.4 oz (68.4 kg).   Resp: Good air movement bilaterally, mild expiratory wheeze at the right lower posterior chest, no respiratory distress Cardio: Regular rate and rhythm GI: No hepatomegaly, left upper quadrant feeding tube Vascular: No leg edema    Lab Results:  Lab Results  Component Value Date   WBC 10.6 (H) 12/21/2019   HGB 11.2 (L) 12/21/2019   HCT 32.5 (L) 12/21/2019   MCV 98.5 12/21/2019   PLT 306 12/21/2019   NEUTROABS 8.5 (H) 12/21/2019    CMP  Lab Results  Component Value Date   NA 134 (L) 12/21/2019   K 4.9 12/21/2019   CL 99 12/21/2019   CO2 24 12/21/2019   GLUCOSE 123 (H) 12/21/2019   BUN 19 12/21/2019   CREATININE 0.58 (L) 12/21/2019   CALCIUM 8.8 (L) 12/21/2019   PROT 6.6 12/21/2019   ALBUMIN 2.6 (L) 12/21/2019   AST 29 12/21/2019   ALT 48 (H) 12/21/2019   ALKPHOS 96 12/21/2019   BILITOT 0.5 12/21/2019   GFRNONAA >60 12/21/2019   GFRAA >60 10/09/2019    Medications: I have reviewed the patient's current  medications.   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 Upper endoscopy 08/21/2019-nonobstructing mass in the mid esophagus, biopsy confirmed invasive squamous cell carcinoma, CPS 20 ? CTs 08/27/2019-elongated circumferential thickening of the midesophagus consistent with a primary esophageal malignancy, 8 mm right paraesophageal node suspicious for metastasis, very fine centrilobular pulmonary nodules felt most likely benign ? PET scan 2/83/6629-[id number] hypermetabolic mid esophagus mass, mildly hypermetabolic high right paratracheal paraesophageal node no other evidence of metastatic disease ? EUS 09/11/2019-fungating mass in the mid esophagus at 26 cm, partially obstructing, tumor invaded the muscularis propria, 2 abnormal lymph nodes were visualized in the middle paraesophageal mediastinum, mass could not be traversed, at leastuT3uN1, lymph nodes or level 70M-not biopsied secondary to requirement to traverse tumor ? Began neoadjuvant concurrent chemoradiation with taxol 50 mg/m2 and carboplatin AUC 2 on 09/12/2019  ? Day 1 taxol/carbo 09/12/19 ? Day 8 taxol/carbo 09/18/19 ? Day 15 taxol/carbo 09/25/19 ? Day 22 Taxol/carboplatin 10/02/2019 ? Day 29 Taxol/carboplatin 10/09/2019 ? PET scan at [facility] 11/29/2019-decrease in thickening and hypermetabolic activity of the mid esophagus, no evidence of metastatic disease, paravertebral right lower lobe pulmonary opacities favored to represent atelectasis radiation change ? Esophagectomy and tube jejunostomy 11/30/2019, residual squamous cell carcinoma-moderately differentiated, negative resection margins, treatment effect present (score 2), 0/13 nodes, pT3pN0 2.Dysphagia/odynophagia secondary to #1 3.Alphagalmeat allergy 4.History of squamous cell skin cancer 5. Maculopapular  and pustular rash concentrated to upper chest and back, scattered throughout abdomen, low back, arms - likely related to radiation field, heat, and  steroids  6.  Esophageal anastomotic leak-confirmed on upper endoscopy 12/31/2019, status post placement of an esophageal stent 7.  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  He was diagnosed with an esophageal leak and underwent placement of an esophagus stent.  He is scheduled for follow-up with Dr. Elenor Quinones in early January.  He has undergone a left vocal cord medialization procedure for treatment of left vocal cord paralysis.  He has been referred to speech therapy.  I discussed adjuvant treatment options with Mr. Marietta.  We discussed recent data confirming a disease-free survival benefit with nivolumab therapy in patients with residual esophagus cancer.  We reviewed potential toxicities associate with nivolumab including the chance of a rash, diarrhea, hypothyroidism, and other autoimmune phenomenon.  He would like to wait on beginning nivolumab until he has recovered further from surgery and after the holidays.  He will return for an office visit and further discussion on 01/25/2020.  Betsy Coder, MD  01/08/2020  12:44 PM

## 2020-01-09 ENCOUNTER — Telehealth: Payer: Self-pay | Admitting: Oncology

## 2020-01-09 ENCOUNTER — Inpatient Hospital Stay: Payer: PPO | Attending: Radiation Oncology

## 2020-01-09 DIAGNOSIS — Z85828 Personal history of other malignant neoplasm of skin: Secondary | ICD-10-CM | POA: Insufficient documentation

## 2020-01-09 DIAGNOSIS — C154 Malignant neoplasm of middle third of esophagus: Secondary | ICD-10-CM | POA: Insufficient documentation

## 2020-01-09 DIAGNOSIS — J3801 Paralysis of vocal cords and larynx, unilateral: Secondary | ICD-10-CM | POA: Insufficient documentation

## 2020-01-09 DIAGNOSIS — C159 Malignant neoplasm of esophagus, unspecified: Secondary | ICD-10-CM | POA: Diagnosis not present

## 2020-01-09 NOTE — Progress Notes (Signed)
Nutrition Follow-up:  Patient with esophageal cancer, followed by Dr Benay Spice.  Patient has completed chemotherapy and radiation on 9/3.  Patient s/p esophagectomy and jejunostomy tube placement on 11/30/2019 at [facility].  Patient with vocal cord paralysis and left vocal cord medialization done on 01/02/2020.  Patient has also had stent placed on 12/31/19 due to leak at [facility].    Spoke with wife and patient via phone for nutrition follow-up.  Patient has been able to swallow much better following procedure on 11/24.  Saw SLP at [facility] yesterday, planning MBSS on 12/14.  Wife reports SLP was ok with patient eating soft foods and thin liquids.  Patient has eaten chicken salad, macaroni and cheese, baked sweet potato, eggs.  Has been drinking liquids orally as well.  Wife reports feels full from tube feeding.  Patient has been using D[employer] peptide at 61ml/hr for about 18 hours (getting about 5 cartons in daily).      Medications: reviewed  Labs: reviewed  Anthropometrics:   Weight 150 lb 14. 4 oz on 11/30 decreased from 155 lb on 11/3.    Patient was on antibiotics that caused diarrhea and weight loss per wife.     Estimated Energy Needs  Kcals: 2100-2450 Protein: 105-122 g Fluid: > 2.1 L  NUTRITION DIAGNOSIS: Inadequate oral intake improving with recent procedure   INTERVENTION:  Patient wanting to eat more orally and wean off tube feeding.  Recommend wife/patient speak with surgeon regarding consistency of food to eat with recent stent placement (soft foods, liquids, etc).  Wife will reach out to MD office today.   Discussed foods and things to consider with recent surgery.  Will email handout on Diet after Esophagectomy to wife and patient.   Recommend reducing [employer] Peptide by 1 carton weekly and measuring weight at least weekly.  If weight stays the same continue to reduce tube feeding by 1 carton weekly.  Keep rate at 33ml/hr and will shorten amount of time on pump.  Wife will  feed patient during the evening hours to allow patient to eat more during the day.   Wife and patient have contact information    MONITORING, EVALUATION, GOAL: weight trends, tube feeding   NEXT VISIT: Dec 15th by phone  Graceanne Guin B. Zenia Resides, [place], [place] Registered Dietitian [phone] (mobile)

## 2020-01-09 NOTE — Telephone Encounter (Signed)
Scheduled appointment per 11/30 los. Spoke to patient's wife who is aware of appointment date and time.

## 2020-01-15 DIAGNOSIS — C159 Malignant neoplasm of esophagus, unspecified: Secondary | ICD-10-CM | POA: Diagnosis not present

## 2020-01-17 ENCOUNTER — Telehealth: Payer: Self-pay

## 2020-01-17 NOTE — Telephone Encounter (Signed)
Patient's wife Butch Penny calls stating that patient continues with a persistent dry cough, not sleeping because of the cough.  Has tried Mucinex and Mucinex DM which only helps a tiny bit.  She denies he is having any fevers.  He does have thick phlegm production which he has had ever since surgery.  She is asking if he should be seen tomorrow in Symptom Management.  I told her I would check with Dr. Benay Spice for advice and call her back.   Of note he is having a swallow study next Tuesday.

## 2020-01-17 NOTE — Telephone Encounter (Signed)
Per Dr. Benay Spice I called patient's wife back to let her know that he feels the cough has resulted from his surgery, his vocal cord dysfunction and increase phlegm production.  He is recommending that she reach out to Dr. Constance Holster to see if he has specific recommendations based on this.  She verbalized an understanding and states she will.

## 2020-01-22 DIAGNOSIS — R1310 Dysphagia, unspecified: Secondary | ICD-10-CM | POA: Diagnosis not present

## 2020-01-22 DIAGNOSIS — R1312 Dysphagia, oropharyngeal phase: Secondary | ICD-10-CM | POA: Diagnosis not present

## 2020-01-22 DIAGNOSIS — R633 Feeding difficulties, unspecified: Secondary | ICD-10-CM | POA: Diagnosis not present

## 2020-01-22 DIAGNOSIS — C159 Malignant neoplasm of esophagus, unspecified: Secondary | ICD-10-CM | POA: Diagnosis not present

## 2020-01-22 DIAGNOSIS — J3800 Paralysis of vocal cords and larynx, unspecified: Secondary | ICD-10-CM | POA: Diagnosis not present

## 2020-01-23 ENCOUNTER — Inpatient Hospital Stay: Payer: PPO

## 2020-01-23 NOTE — Progress Notes (Signed)
Nutrition Follow-up:  Patient with esophageal cancer, followed by Dr Benay Spice.  Patient completed chemotherapy and radiation on 9/3.  S/p esophagectomy with J tube placement on 11/30/2019 at [facility].  Vocal cord paralysis and left vocal cord medialization done on 01/02/2020.  Patient had stent placed on 12/31/19 due to leak at [facility], plans for removal on 1/3.   Spoke with wife and had MBSS done yesterday at [facility].  Notes reviewed.  Wife reports back up at the esophageal stent location.  Awaiting call back from [facility].  Patient taking mostly liquids, purees.  SLP to work on strengthening swallow muscles.  Wife reports had decreased feeding from 5 cartons to 4 cartons for about 1 week then 3 1/2 cartons but oral intake has been effected by cough and foods backing up.  Wife reports taking little bites of this and that, drinking liquids.      Medications: reviewed  Labs: reviewed  Anthropometrics:   Wife at home weight 151-153 lb  Estimated Energy Needs  Kcals: 2100-2450 Protein: 105-122 g Fluid: > 2.1 L  NUTRITION DIAGNOSIS: Inadequate oral intake continues    INTERVENTION:  Recommend increasing tube feeding back up to 4-5 cartons per day of [employer] peptide as oral intake decrease/limited at this time.  Wife to continue to measure weight.   Encouraged patient to continue swallowing liquids/puree foods per SLP to strengthen muscles.   Contact information provided    MONITORING, EVALUATION, GOAL: weight trends, intake, tube feeding   NEXT VISIT: Jan 12 phone  Diamonds Lippard B. Zenia Resides, [place], [place] Registered Dietitian [phone] (mobile)

## 2020-01-24 ENCOUNTER — Other Ambulatory Visit: Payer: Self-pay

## 2020-01-24 ENCOUNTER — Ambulatory Visit: Payer: PPO | Admitting: Nurse Practitioner

## 2020-01-24 ENCOUNTER — Inpatient Hospital Stay (HOSPITAL_BASED_OUTPATIENT_CLINIC_OR_DEPARTMENT_OTHER): Payer: PPO | Admitting: Nurse Practitioner

## 2020-01-24 VITALS — BP 142/85 | HR 123 | Temp 99.6°F | Resp 17 | Ht 70.0 in | Wt 151.9 lb

## 2020-01-24 DIAGNOSIS — Z85828 Personal history of other malignant neoplasm of skin: Secondary | ICD-10-CM | POA: Diagnosis not present

## 2020-01-24 DIAGNOSIS — J3801 Paralysis of vocal cords and larynx, unilateral: Secondary | ICD-10-CM | POA: Diagnosis not present

## 2020-01-24 DIAGNOSIS — C154 Malignant neoplasm of middle third of esophagus: Secondary | ICD-10-CM

## 2020-01-24 NOTE — Progress Notes (Addendum)
[facility] OFFICE PROGRESS NOTE   Diagnosis: Esophagus cancer  INTERVAL HISTORY:   Raymond Hess returns as scheduled.  He reports continued difficulty with swallowing.  He has decreased diet to liquids and is increasing tube feedings.  He has intermittent regurgitation.  His wife reports fairly consistent tachycardia since surgery, averaging about 115 at rest.  Recently noted he becomes "winded" with activity.  He continues to have a cough.  No fever.  Objective:  Vital signs in last 24 hours:  Blood pressure (!) 142/85, pulse (!) 123, temperature 99.6 F (37.6 C), temperature source Tympanic, resp. rate 17, height 5\' 10"  (1.778 m), weight 151 lb 14.4 oz (68.9 kg), SpO2 100 %.    Resp: Faint rales left lung base.  No respiratory distress. Cardio: Regular, tachycardic. GI: No hepatomegaly.  Left abdomen feeding tube site is without erythema.  Small granulation tissue to the left side of the tube. Vascular: No leg edema. Neuro: Alert and oriented. Skin: Minimal decrease in skin turgor.  Maculopapular rash mid back region.   Lab Results:  Lab Results  Component Value Date   WBC 10.6 (H) 12/21/2019   HGB 11.2 (L) 12/21/2019   HCT 32.5 (L) 12/21/2019   MCV 98.5 12/21/2019   PLT 306 12/21/2019   NEUTROABS 8.5 (H) 12/21/2019    Imaging:  No results found.  Medications: I have reviewed the patient's current medications.  Assessment/Plan: 1. Squamous cell carcinoma of the midesophagus ? Upper endoscopy 08/21/2019-nonobstructing mass in the mid esophagus, biopsy confirmed invasive squamous cell carcinoma, CPS 20 ? CTs 08/27/2019-elongated circumferential thickening of the midesophagus consistent with a primary esophageal malignancy, 8 mm right paraesophageal node suspicious for metastasis, very fine centrilobular pulmonary nodules felt most likely benign ? PET scan 1/88/4166-[id number] hypermetabolic mid esophagus mass, mildly hypermetabolic high right paratracheal  paraesophageal node no other evidence of metastatic disease ? EUS 09/11/2019-fungating mass in the mid esophagus at 26 cm, partially obstructing, tumor invaded the muscularis propria, 2 abnormal lymph nodes were visualized in the middle paraesophageal mediastinum, mass could not be traversed, at leastuT3uN1, lymph nodes or level 20M-not biopsied secondary to requirement to traverse tumor ? Began neoadjuvant concurrent chemoradiation with taxol 50 mg/m2 and carboplatin AUC 2 on 09/12/2019  ? Day 1 taxol/carbo 09/12/19 ? Day 8 taxol/carbo 09/18/19 ? Day 15 taxol/carbo 09/25/19 ? Day 22 Taxol/carboplatin 10/02/2019 ? Day 29 Taxol/carboplatin 10/09/2019 ? PET scan at [facility] 11/29/2019-decrease in thickening and hypermetabolic activity of the mid esophagus, no evidence of metastatic disease, paravertebral right lower lobe pulmonary opacities favored to represent atelectasis radiation change ? Esophagectomy and tube jejunostomy 11/30/2019, residual squamous cell carcinoma-moderately differentiated, negative resection margins, treatment effect present (score 2), 0/13 nodes, pT3pN0 2.Dysphagia/odynophagia secondary to #1 3.Alphagalmeat allergy 4.History of squamous cell skin cancer 5. Maculopapular and pustular rash concentrated to upper chest and back, scattered throughout abdomen, low back, arms -likelyrelated to radiation field, heat, and steroids 6.  Esophageal anastomotic leak-confirmed on upper endoscopy 12/31/2019, status post placement of an esophageal stent 7.  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  Tachycardia 9.  CT chest 12/21/2019-small segmental medial left lower lobe pulmonary embolus, slightly eccentric, favoring subacute/chronic.  No compelling findings of acute pulmonary embolism.    Disposition: Raymond Hess appears stable.  Dr. Benay Spice reviewed the data for completing a course of nivolumab in patients with  residual esophagus cancer with Raymond Hess and his wife.  We again reviewed potential toxicities.  Raymond Hess would like to begin nivolumab after the stent is removed in early January.  He has tachycardia of unclear etiology.  We discussed deconditioning, dehydration.  Review of chest CT done in the emergency department on 12/21/2019 shows a small segmental medial left lower lobe pulmonary embolus favored subacute/chronic.  We are referring him for a follow-up CT chest PE protocol.  We have tentatively scheduled follow-up and nivolumab 02/22/2019.  We will see him sooner pending the chest CT result.  Patient seen with Dr. Benay Spice.    Ned Card ANP/GNP-BC   01/24/2020  3:08 PM This was a shared visit with Ned Card.  Raymond. Schumpert was interviewed and examined.  He continues to have complications from the esophagectomy procedure.  We discussed the indication for adjuvant nivolumab.  We reviewed potential toxicities associated with nivolumab.  He would like to hold on beginning adjuvant therapy until the esophagus stent is removed in January.  He has persistent tachycardia.  We noted a chest CT last month revealed a left lower lobe pulmonary embolus.  He will be referred for a repeat chest CT to rule out recurrent pulmonary embolism.  Julieanne Manson, MD

## 2020-01-24 NOTE — Progress Notes (Signed)
Nutrition  Received message from NP to touch base with wife regarding free water requirements.    Called wife and left message that RD would be sending email with recommendations. Wife has contact information and email.  If patient take 4 cartons of Dillard Essex daily will need 5 cups of water to meet hydration needs.  Ideally needs to drink this to use muscles.  Would recommend at least 27ml via J-tube while running q 4-6 hours to keep from clogging and when starting tube feeding and stopping tube feeding.  If taking 5 cartons of tube feeding will need 4 cups of water daily to better meet hydration needs.    Raymond Hess B. Zenia Resides, [place], [place] Registered Dietitian [phone] (mobile)

## 2020-01-25 ENCOUNTER — Telehealth: Payer: Self-pay

## 2020-01-25 ENCOUNTER — Inpatient Hospital Stay: Payer: PPO

## 2020-01-25 ENCOUNTER — Ambulatory Visit (HOSPITAL_BASED_OUTPATIENT_CLINIC_OR_DEPARTMENT_OTHER)
Admission: RE | Admit: 2020-01-25 | Discharge: 2020-01-25 | Disposition: A | Discharge status: Home / Self Care | Payer: PPO | Source: Ambulatory Visit | Attending: Nurse Practitioner | Admitting: Nurse Practitioner

## 2020-01-25 ENCOUNTER — Telehealth: Payer: Self-pay | Admitting: Nurse Practitioner

## 2020-01-25 ENCOUNTER — Other Ambulatory Visit: Payer: Self-pay

## 2020-01-25 DIAGNOSIS — C154 Malignant neoplasm of middle third of esophagus: Secondary | ICD-10-CM

## 2020-01-25 DIAGNOSIS — J90 Pleural effusion, not elsewhere classified: Secondary | ICD-10-CM | POA: Diagnosis not present

## 2020-01-25 DIAGNOSIS — J9811 Atelectasis: Secondary | ICD-10-CM | POA: Diagnosis not present

## 2020-01-25 DIAGNOSIS — J189 Pneumonia, unspecified organism: Secondary | ICD-10-CM | POA: Diagnosis not present

## 2020-01-25 DIAGNOSIS — R000 Tachycardia, unspecified: Secondary | ICD-10-CM | POA: Diagnosis not present

## 2020-01-25 LAB — BASIC METABOLIC PANEL - CANCER CENTER ONLY
Anion gap: 7 (ref 5–15)
BUN: 14 mg/dL (ref 8–23)
CO2: 28 mmol/L (ref 22–32)
Calcium: 9.4 mg/dL (ref 8.9–10.3)
Chloride: 97 mmol/L — ABNORMAL LOW (ref 98–111)
Creatinine: 0.69 mg/dL (ref 0.61–1.24)
GFR, Estimated: >60 mL/min (ref >60)
Glucose, Bld: 109 mg/dL — ABNORMAL HIGH (ref 70–99)
Potassium: 4.6 mmol/L (ref 3.5–5.1)
Sodium: 132 mmol/L — ABNORMAL LOW (ref 135–145)

## 2020-01-25 MED ORDER — IOHEXOL 300 MG/ML  SOLN: 100.0000 mL | Freq: Once | INTRAMUSCULAR | Status: DC | PRN

## 2020-01-25 MED ORDER — IOHEXOL 350 MG/ML SOLN
100.0000 mL | Freq: Once | INTRAVENOUS | Status: AC | PRN
  Administered 2020-01-25: 09:00:00 69 mL via INTRAVENOUS

## 2020-01-25 NOTE — Telephone Encounter (Signed)
Call spoke with pt wife concerning CT scan results this nurse attempted to follow up with Dr Florentina Jenny office and left a message for a return call.   Awaiting [facility] to return call

## 2020-01-25 NOTE — Telephone Encounter (Signed)
Scheduled appointments per 12/16 los. Spoke to patient's wife who is aware of appointments date and times.

## 2020-01-28 ENCOUNTER — Telehealth: Payer: Self-pay

## 2020-01-28 ENCOUNTER — Telehealth: Payer: Self-pay | Admitting: Nurse Practitioner

## 2020-01-28 NOTE — Telephone Encounter (Signed)
I contacted Mr. Gustafson wife to discuss the recent CT report.  She has many questions regarding findings on the CT that I feel would be more appropriate for Dr. Florentina Jenny office to address.  We will contact Dr. Florentina Jenny office asking them to review the scan and contact her.  She agrees with this plan.

## 2020-01-28 NOTE — Telephone Encounter (Signed)
Dr. Florentina Jenny office called this nurse asked they call the wife to go over question she has about last visit office secretary states she will forward the message

## 2020-01-31 ENCOUNTER — Telehealth: Payer: Self-pay

## 2020-01-31 NOTE — Telephone Encounter (Signed)
Received an email from patient's wife Butch Penny expressing frustration that they have don't heard anything from Dr. Florentina Jenny office regarding his recent scan results.  I reviewed the chart and emailed her back that it is clearly noted in the chart that the results were faxed and also one of our nurses called his office requesting he call the patient regarding the post surgical changes noted on the results.  I have suggested she reach out to his office again.

## 2020-02-06 ENCOUNTER — Ambulatory Visit: Payer: PPO | Admitting: Speech Pathology

## 2020-02-06 ENCOUNTER — Emergency Department (HOSPITAL_COMMUNITY): Payer: PPO

## 2020-02-06 ENCOUNTER — Emergency Department (HOSPITAL_COMMUNITY)
Admission: EM | Admit: 2020-02-06 | Discharge: 2020-02-07 | Disposition: A | Discharge status: Home / Self Care | Payer: PPO | Attending: Emergency Medicine | Admitting: Emergency Medicine

## 2020-02-06 DIAGNOSIS — Z01818 Encounter for other preprocedural examination: Secondary | ICD-10-CM | POA: Diagnosis not present

## 2020-02-06 DIAGNOSIS — R000 Tachycardia, unspecified: Secondary | ICD-10-CM | POA: Insufficient documentation

## 2020-02-06 DIAGNOSIS — Z20822 Contact with and (suspected) exposure to covid-19: Secondary | ICD-10-CM | POA: Diagnosis not present

## 2020-02-06 DIAGNOSIS — I2510 Atherosclerotic heart disease of native coronary artery without angina pectoris: Secondary | ICD-10-CM | POA: Diagnosis not present

## 2020-02-06 DIAGNOSIS — Z9889 Other specified postprocedural states: Secondary | ICD-10-CM | POA: Diagnosis not present

## 2020-02-06 DIAGNOSIS — K920 Hematemesis: Secondary | ICD-10-CM | POA: Insufficient documentation

## 2020-02-06 DIAGNOSIS — R578 Other shock: Secondary | ICD-10-CM | POA: Diagnosis not present

## 2020-02-06 DIAGNOSIS — C159 Malignant neoplasm of esophagus, unspecified: Secondary | ICD-10-CM | POA: Diagnosis not present

## 2020-02-06 DIAGNOSIS — Z4682 Encounter for fitting and adjustment of non-vascular catheter: Secondary | ICD-10-CM | POA: Diagnosis not present

## 2020-02-06 DIAGNOSIS — R112 Nausea with vomiting, unspecified: Secondary | ICD-10-CM | POA: Diagnosis present

## 2020-02-06 DIAGNOSIS — J984 Other disorders of lung: Secondary | ICD-10-CM | POA: Diagnosis not present

## 2020-02-06 DIAGNOSIS — J181 Lobar pneumonia, unspecified organism: Secondary | ICD-10-CM | POA: Diagnosis not present

## 2020-02-06 DIAGNOSIS — Z8719 Personal history of other diseases of the digestive system: Secondary | ICD-10-CM | POA: Diagnosis not present

## 2020-02-06 DIAGNOSIS — R1111 Vomiting without nausea: Secondary | ICD-10-CM | POA: Diagnosis not present

## 2020-02-06 DIAGNOSIS — R918 Other nonspecific abnormal finding of lung field: Secondary | ICD-10-CM | POA: Diagnosis not present

## 2020-02-06 DIAGNOSIS — Z8501 Personal history of malignant neoplasm of esophagus: Secondary | ICD-10-CM | POA: Insufficient documentation

## 2020-02-06 DIAGNOSIS — R0902 Hypoxemia: Secondary | ICD-10-CM | POA: Diagnosis not present

## 2020-02-06 LAB — CBC WITH DIFFERENTIAL/PLATELET
Abs Immature Granulocytes: 0.15 10*3/uL — ABNORMAL HIGH (ref 0.00–0.07)
Basophils Absolute: 0 10*3/uL (ref 0.0–0.1)
Basophils Relative: 0 %
Eosinophils Absolute: 0.2 10*3/uL (ref 0.0–0.5)
Eosinophils Relative: 1 %
HCT: 16.1 % — ABNORMAL LOW (ref 39.0–52.0)
Hemoglobin: 5.3 g/dL — CL (ref 13.0–17.0)
Immature Granulocytes: 1 %
Lymphocytes Relative: 19 %
Lymphs Abs: 3.1 10*3/uL (ref 0.7–4.0)
MCH: 30.5 pg (ref 26.0–34.0)
MCHC: 32.9 g/dL (ref 30.0–36.0)
MCV: 92.5 fL (ref 80.0–100.0)
Monocytes Absolute: 1.4 10*3/uL — ABNORMAL HIGH (ref 0.1–1.0)
Monocytes Relative: 8 %
Neutro Abs: 11.5 10*3/uL — ABNORMAL HIGH (ref 1.7–7.7)
Neutrophils Relative %: 71 %
Platelets: 600 10*3/uL — ABNORMAL HIGH (ref 150–400)
RBC: 1.74 MIL/uL — ABNORMAL LOW (ref 4.22–5.81)
RDW: 15 % (ref 11.5–15.5)
WBC: 16.4 10*3/uL — ABNORMAL HIGH (ref 4.0–10.5)
nRBC: 0 % (ref 0.0–0.2)

## 2020-02-06 LAB — PROTIME-INR
INR: 1.1 (ref 0.8–1.2)
Prothrombin Time: 13.4 seconds (ref 11.4–15.2)

## 2020-02-06 LAB — BASIC METABOLIC PANEL
Anion gap: 11 (ref 5–15)
BUN: 20 mg/dL (ref 8–23)
CO2: 24 mmol/L (ref 22–32)
Calcium: 8.9 mg/dL (ref 8.9–10.3)
Chloride: 98 mmol/L (ref 98–111)
Creatinine, Ser: 0.7 mg/dL (ref 0.61–1.24)
GFR, Estimated: >60 mL/min (ref >60)
Glucose, Bld: 120 mg/dL — ABNORMAL HIGH (ref 70–99)
Potassium: 4.5 mmol/L (ref 3.5–5.1)
Sodium: 133 mmol/L — ABNORMAL LOW (ref 135–145)

## 2020-02-06 LAB — RESP PANEL BY RT-PCR (FLU A&B, COVID) ARPGX2
Influenza A by PCR: NEGATIVE
Influenza B by PCR: NEGATIVE
SARS Coronavirus 2 by RT PCR: NEGATIVE

## 2020-02-06 LAB — ABO/RH: ABO/RH(D): O POS

## 2020-02-06 MED ORDER — FAMOTIDINE 20 MG IN NS 100 ML IVPB
20.0000 mg | Freq: Once | INTRAVENOUS | Status: AC
  Administered 2020-02-06: 20 mg via INTRAVENOUS
  Filled 2020-02-06: qty 100

## 2020-02-06 MED ORDER — PIPERACILLIN-TAZOBACTAM 3.375 G IVPB 30 MIN
3.3750 g | Freq: Once | INTRAVENOUS | Status: AC
  Administered 2020-02-06: 23:00:00 3.375 g via INTRAVENOUS
  Filled 2020-02-06: qty 50

## 2020-02-06 MED ORDER — SODIUM CHLORIDE 0.9 % IV BOLUS
1000.0000 mL | Freq: Once | INTRAVENOUS | Status: AC
  Administered 2020-02-06: 22:00:00 1000 mL via INTRAVENOUS

## 2020-02-06 MED ORDER — VANCOMYCIN HCL 1500 MG/300ML IV SOLN
1500.0000 mg | Freq: Once | INTRAVENOUS | Status: DC
  Filled 2020-02-06: qty 300

## 2020-02-06 MED ORDER — ONDANSETRON HCL 4 MG/2ML IJ SOLN
4.0000 mg | Freq: Once | INTRAMUSCULAR | Status: DC
  Filled 2020-02-06: qty 2

## 2020-02-06 MED ORDER — IOHEXOL 300 MG/ML  SOLN
75.0000 mL | Freq: Once | INTRAMUSCULAR | Status: AC | PRN
  Administered 2020-02-06: 75 mL via INTRAVENOUS

## 2020-02-06 NOTE — ED Notes (Addendum)
Pt BP noted to be hypotensive, diaphoretic, and reporting lightheadedness. PA notified and at bedside. Blood bank called for emergency blood administration.

## 2020-02-06 NOTE — ED Notes (Addendum)
Second Rn verification by Jon Gills, RN  Pre blood vitals:  Temp: 97.7  HR: 130  Pulse rate: 130  BP: 98/62 (73)  Spo2: 99%

## 2020-02-06 NOTE — ED Triage Notes (Signed)
Pt bib gems c/o vomiting blood - onset approx 2015 tonight. Pt had esophagus removed on 10/22 that was not healing correctly so a stent was placed on 11/22. Pt also had vocal cord surgery on 11/24 due to damage from previous surgery. EMS reports approx 100 cc of blood loss on the scene. Pt reports taking tylenol at 1900 - unknown dosage. 4 mg zofran given by EMS PTA.   117/78  HR: 138 (pt reports baseline hr is 110) 93% RA

## 2020-02-06 NOTE — ED Notes (Signed)
Called [facility] for consult. Transferred to Dr.

## 2020-02-06 NOTE — ED Provider Notes (Signed)
[facility] EMERGENCY DEPARTMENT Provider Note   CSN: [id number] Arrival date & time: 02/06/20  2113     History Chief Complaint  Patient presents with  . Hematemesis    Raymond Hess is a 69 y.o. male w/ hx of esophageal cancer s/p esophagectomy with gastric pullthrough with Dr Rip Harbour at [facility] in October 2021, c/b anastamosis leak s/p stent placement Nov 2021 at [facility], s/p J tube placement, presenting to ED with hematemesis.  Began having bright red blood vomit earlier today.  EMS reports approx 100 cc blood loss on scene.  Patient reporting nausea, chronic pain in mid sternum and esophagus.  He takes most feeds through his J tube, only a little by mouth.    Heart rate tachycardic at baseline per patient, normally around 100-110 bpm.  Not on active chemo or radiation therapy. Not on blood thinners.  HPI     Past Medical History:  Diagnosis Date  . Angio-edema   . Cancer ([place])   . Complication of anesthesia    long surgery - colon slow to awaken  . History of esophagogastroduodenoscopy (EGD) 12/30/21   at [facility]  . Urticaria     Patient Active Problem List   Diagnosis Date Noted  . Vocal cord paralysis 01/02/2020  . Cancer of middle third of esophagus ([place]) 08/28/2019  . 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 CATARACT EXTRACTION, BILATERAL Bilateral   . COLONOSCOPY    . ESOPHAGEAL DILATION    . ESOPHAGEAL DILATION  12/31/2019   at [facility]  . Esophageal Stent  12/31/2019   at [facility]  . ESOPHAGECTOMY  11/30/2019  . ESOPHAGOGASTRODUODENOSCOPY (EGD) WITH PROPOFOL N/A 09/11/2019   Procedure: ESOPHAGOGASTRODUODENOSCOPY (EGD) WITH PROPOFOL;  Surgeon: Arta Silence, MD;  Location: [facility];  Service: Endoscopy;  Laterality: N/A;  . EUS N/A 09/11/2019   Procedure: UPPER ENDOSCOPIC ULTRASOUND (EUS) RADIAL;  Surgeon:  Arta Silence, MD;  Location: [facility];  Service: Endoscopy;  Laterality: N/A;  . LARYNGOPLASTY Left 01/02/2020   Procedure: LEFT VOCAL CORD MEDIALIZATION LARYNGOPLASTY;  Surgeon: Izora Gala, MD;  Location: [place];  Service: ENT;  Laterality: Left;  . LARYNGOSCOPY Left 01/02/2020   Procedure: LARYNGOSCOPY;  Surgeon: Izora Gala, MD;  Location: [place];  Service: ENT;  Laterality: Left;  Marland Kitchen MICROLARYNGOSCOPY Bilateral 12/25/2019   Procedure: MICROLARYNGOSCOPY BILATERAL VOCAL CORD AUGMENTATION & INJECTION OF PROLARYN;  Surgeon: Izora Gala, MD;  Location: [place];  Service: ENT;  Laterality: Bilateral;       No family history on file.  Social History   Tobacco Use  . Smoking status: Never Smoker  . Smokeless tobacco: Never Used  Vaping Use  . Vaping Use: Never used  Substance Use Topics  . Alcohol use: Not Currently  . Drug use: Never    Home Medications Prior to Admission medications   Medication Sig Start Date End Date Taking? Authorizing Provider  acetaminophen (TYLENOL) 160 MG/5ML elixir Take 15 mg/kg by mouth every 4 (four) hours as needed for fever.    [provider]  guaiFENesin (ROBITUSSIN) 100 MG/5ML liquid Take 200 mg by mouth every 4 (four) hours as needed for cough.    [provider]  magic mouthwash SOLN Take 10 mLs by mouth every 6 (six) hours.    [provider]  Nutritional Supplements ([place] FARMS PED PEPTIDE 1.5) LIQD by Jejunal  Tube route. 95 ml/hour via J-tube from 4 pm to 10 am daily    [provider]  ondansetron (ZOFRAN) 4 MG tablet Take 4 mg by mouth every 8 (eight) hours as needed for nausea or vomiting. dissolvable    [provider]  prochlorperazine (COMPAZINE) 10 MG tablet Take 10 mg by mouth every 6 (six) hours as needed for nausea or vomiting.  Patient not taking: Reported on 01/08/2020    [provider]  sennosides (SENOKOT) 8.8 MG/5ML syrup Take 5 mLs by mouth 3 (three) times daily as needed  for mild constipation. Patient not taking: Reported on 01/08/2020    [provider]  simethicone (MYLICON) 125 MG chewable tablet Chew 125 mg by mouth every 6 (six) hours as needed for flatulence. Patient not taking: Reported on 01/08/2020    [provider]  traMADol (ULTRAM) 50 MG tablet Take 1 tablet (50 mg total) by mouth every 6 (six) hours as needed for moderate pain. Patient not taking: Reported on 01/08/2020 12/12/19   Ladene Artist, MD    Allergies    Alpha-gal, Meat extract, and Oxycodone hcl  Review of Systems   Review of Systems  Constitutional: Negative for chills and fever.  HENT: Negative for ear pain and sore throat.   Eyes: Negative for pain and visual disturbance.  Respiratory: Negative for cough and shortness of breath.   Cardiovascular: Negative for chest pain and palpitations.  Gastrointestinal: Positive for nausea and vomiting. Negative for blood in stool.  Genitourinary: Negative for dysuria and hematuria.  Musculoskeletal: Negative for arthralgias and myalgias.  Skin: Negative for color change and rash.  Neurological: Negative for syncope and headaches.  All other systems reviewed and are negative.   Physical Exam Updated Vital Signs BP 113/75   Pulse (!) 113   Temp 98.2 F (36.8 C) (Oral)   Resp (!) 23   Ht 5\' 10"  (1.778 m)   Wt 68.5 kg   SpO2 99%   BMI 21.67 kg/m   Physical Exam Vitals and nursing note reviewed.  Constitutional:      Appearance: He is well-developed and well-nourished.     Comments: Voice hoarse  HENT:     Head: Normocephalic and atraumatic.  Eyes:     Comments: Conjunctival pallor  Cardiovascular:     Rate and Rhythm: Regular rhythm. Tachycardia present.     Pulses: Normal pulses.  Pulmonary:     Effort: Pulmonary effort is normal. No respiratory distress.  Abdominal:     Palpations: Abdomen is soft.     Tenderness: There is no abdominal tenderness.     Comments: J tube in place  Musculoskeletal:         General: No edema.     Cervical back: Neck supple.  Skin:    General: Skin is warm and dry.     Coloration: Skin is pale.  Neurological:     General: No focal deficit present.     Mental Status: He is alert and oriented to person, place, and time.  Psychiatric:        Mood and Affect: Mood and affect and mood normal.        Behavior: Behavior normal.     ED Results / Procedures / Treatments   Labs (all labs ordered are listed, but only abnormal results are displayed) Labs Reviewed  CBC WITH DIFFERENTIAL/PLATELET - Abnormal; Notable for the following components:      Result Value   WBC 16.4 (*)  RBC 1.74 (*)    Hemoglobin 5.3 (*)    HCT 16.1 (*)    Platelets 600 (*)    Neutro Abs 11.5 (*)    Monocytes Absolute 1.4 (*)    Abs Immature Granulocytes 0.15 (*)    All other components within normal limits  BASIC METABOLIC PANEL - Abnormal; Notable for the following components:   Sodium 133 (*)    Glucose, Bld 120 (*)    All other components within normal limits  RESP PANEL BY RT-PCR (FLU A&B, COVID) ARPGX2  PROTIME-INR  TYPE AND SCREEN  ABO/RH    EKG None  Radiology CT Chest W Contrast  Result Date: 02-24-20 CLINICAL DATA:  Esophageal cancer status post esophagectomy and esophageal stenting. Hematemesis. EXAM: CT CHEST WITH CONTRAST TECHNIQUE: Multidetector CT imaging of the chest was performed during intravenous contrast administration. CONTRAST:  26mL OMNIPAQUE IOHEXOL 300 MG/ML  SOLN COMPARISON:  01/25/2020 FINDINGS: Cardiovascular: Moderate coronary artery calcification primarily within the left anterior descending coronary artery. Proximal left anterior descending coronary artery stenting has been performed. Global cardiac size within normal limits. Trace pericardial effusion is unchanged. Central pulmonary arteries are of normal caliber. The thoracic aorta is unremarkable. Mediastinum/Nodes: Surgical changes of esophagectomy and gastric pull-up are again  identified. And esophageal stent extends from the a hypopharynx just through the esophagogastric anastomosis. This appears unchanged in configuration and position since prior examination. There is, however, extensive intraluminal debris within the stent now identified distally, there is moderate intraluminal debris within the intrathoracic stomach, new since prior examination, possibly representing blood product given the history of hematemesis. Tiny focus of extraluminal gas within the right mediastinum adjacent to the a esophagogastric anastomosis is again identified and appears stable since prior examination. No additional pneumomediastinum is identified. No pathologic thoracic adenopathy. Lungs/Pleura: Consolidative changes are again identified within the paramediastinal right apex and there is paramediastinal cicatricial change within the right suprahilar region possibly reflecting changes of radiation therapy. Consolidative changes within the right apex, however, have progressed in the interval since prior examination. Mild bibasilar atelectasis. No additional superimposed focal pulmonary infiltrate. No focal pulmonary nodules. No pneumothorax or pleural effusion. Central airways are widely patent. Upper Abdomen: Feeding tube is partially visualized within the left upper quadrant small bowel. No acute abnormality identified within the visualized upper abdomen. Musculoskeletal: Osseous structures are age-appropriate. No lytic or blastic bone lesions. No acute bone abnormality. IMPRESSION: Stable appearance of esophagectomy and gastric pull-up and endoluminal stenting of the proximal esophagus across the anastomosis. Unchanged tiny focus of extraluminal gas adjacent to the anastomosis, likely residual or potentially within the proximal gastric lumen. Interval development of extensive debris within the stent and within the intrathoracic gastric lumen possibly representing ingested debris or blood product given  the history of hematemesis. No active extravasation identified. Progressive consolidative changes at the right apex presumably related to radiation therapy. Coronary artery disease. Electronically Signed   By: Fidela Salisbury MD   On: 24-Feb-2020 00:19   DG Chest Portable 1 View  Result Date: 02/06/2020 CLINICAL DATA:  hx of esophageal removal, anastamosis of stomach and oropharynx, here with hematemesis, evaluate for evidence of perforation/free air EXAM: PORTABLE CHEST 1 VIEW COMPARISON:  Chest CT 01/25/2020 FINDINGS: Upper esophageal stent is unchanged in position. There is stable adjacent soft tissue density in the right paratracheal region. Gastric pull-through. No pneumothorax or visualized pneumomediastinum. Left basilar atelectasis or scarring, similar to prior. No significant pleural effusion. Stable heart size and mediastinal contours. No evidence of free air  in the upper abdomen. IMPRESSION: 1. No new abnormality. 2. Upper esophageal stent is unchanged in position with stable right paratracheal soft tissue density. No evidence of pneumomediastinum, pneumothorax, or free air. 3. Stable left basilar atelectasis or scarring. Electronically Signed   By: Keith Rake M.D.   On: 02/06/2020 21:44    Procedures .Critical Care Performed by: Wyvonnia Dusky, MD Authorized by: Wyvonnia Dusky, MD   Critical care provider statement:    Critical care time (minutes):  45   Critical care was necessary to treat or prevent imminent or life-threatening deterioration of the following conditions:  Shock   Critical care was time spent personally by me on the following activities:  Discussions with consultants, evaluation of patient's response to treatment, examination of patient, ordering and performing treatments and interventions, ordering and review of laboratory studies, ordering and review of radiographic studies, pulse oximetry, re-evaluation of patient's condition, obtaining history from patient  or surrogate and review of old charts   (including critical care time)  Medications Ordered in ED Medications  sodium chloride 0.9 % bolus 1,000 mL (0 mLs Intravenous Stopped 2020-02-25 0100)  famotidine (PEPCID) IVPB 20 mg in NS 100 mL IVPB (0 mg Intravenous Stopped 02/06/20 2231)  piperacillin-tazobactam (ZOSYN) IVPB 3.375 g (0 g Intravenous Stopped 02/25/20 0004)  iohexol (OMNIPAQUE) 300 MG/ML solution 75 mL (75 mLs Intravenous Contrast Given 02/06/20 2343)    ED Course  I have reviewed the triage vital signs and the nursing notes.  Pertinent labs & imaging results that were available during my care of the patient were reviewed by me and considered in my medical decision making (see chart for details).  69 yo male w/ hx as noted above here with suspected hematemesis.     This involves an extensive number of treatment options, and is a complaint that carries with it a high risk of complications and morbidity.  The differential diagnosis includes anastomosis leak vs gastric ulcer/duodonal ulcer vs other  No hypoxia, pleuritic chest pain, or persistent coughing to suggest this is an acute PE, or that the blood is from a pulmonary source.  I ordered, reviewed, and interpreted labs, which included BMP near baseline, CBC with WBC 16.4, Hgb 5.3, platelets 600.  INR 1.1. I ordered medication IV zosyn for empiric coverage for possible mediastinitis/perforation, IV pepcid, IV fluids, and pRBC transfusion for anemia and suspected GI bleed I ordered imaging studies which included DG chest (no acute findings per my interpretation), and CT chest with contrast (imaging pending at the time of signout) Additional history was obtained from patient's wife at bedside Previous records obtained and reviewed showing surgical records from [facility] I consulted CT surgery, GI team and discussed lab and imaging findings as noted below   After the interventions stated above, I reevaluated the patient and  found BP stabilized, patient feeling subjectively better.  HR remains mildly tachycardic.  The plan to transfer to [facility] was discussed between the ED team and the patient and his wife.   Clinical Course as of 02-25-20 1016  Wed Feb 06, 2020  2211 Spoke to [facility] transfer ctr awaiting callback from ct surgeon [MT]  2241 Pt became transiently hypotensive, AMS, laid flat and bolus fluids given, BP improved.  Now upright.  2 units pRBC ordered emergently from blood bank and hanging now. Concern for GI bleed [MT]  2248 While awaiting callback from the [facility] transfer center, I spoke to Dr Roxan Hockey from [place] [facility] at [facility], who reports this  hematemesis even if a suspected leak from anastomosis would be managed by GI primarily here.  Subsequently I paged and spoke to Dr Angela Cox from [facility], who reported this patient follows with [facility], and their team should be contacted.  Immediately after this discussion, I was contacted by the [facility], who accepted the patient for transfer. [MT]  2254 I spoke to [facility] Dr Erie Noe CT surgery who has accepted the patient for ED to ED transfer.  BP remains stable [MT]  2324 I signed out the patient to Dr. Laverta Baltimore, EDP, with plan to transfer to [facility] ED to ED. Vitals remain stable.  Hgb low at 5.3 and likely cause of symptoms noted earlier - blood loss anemia.  But with fluids and blood transfusion BP has stabilized, and he is feeling better.  I do believe he is HD stable at this time, and I advised the evening EDP to continue blood transfusions as needed.   [MT]    Clinical Course User Index [MT] Neveyah Garzon, Carola Rhine, MD    Final Clinical Impression(s) / ED Diagnoses Final diagnoses:  Hematemesis with nausea  Hemorrhagic shock [facility])    Rx / DC Orders ED Discharge Orders    None       Wyvonnia Dusky, MD 02-28-2020 1016

## 2020-02-06 NOTE — ED Notes (Addendum)
Pt transported to CT with this RN 

## 2020-02-06 NOTE — ED Provider Notes (Signed)
Blood pressure 119/66, pulse (!) 107, temperature 97.9 F (36.6 C), resp. rate 19, height 5\' 10"  (1.778 m), weight 68.5 kg, SpO2 98 %.  Assuming care from Dr. .  In short, Raymond Hess is a 69 y.o. male with a chief complaint of Hematemesis .  Refer to the original H&P for additional details.  The current plan of care is to transfer to [facility] ED. Transfer has been arranged. Emergency release blood given and patient feeling better. BP improved. Managing airway and conversational. CXR clear. Discussed transfer plan with patient and wife at bedside.   11:55 PM  Patient returned from CT chest with some additional BRB in emesis. Mental status unchanged. Remains tachy. BP normal. Will reach out to [facility] GI.   12:28 AM  Patient continues to look well overall. Remains tachy but BP stable. Discussed case with Dr. [facility] given additional vomiting. Would not advise EGD here emergently and if possible patient should be at [facility] where definitive mgmt is possible. Patient remains safe for transfer at this time and transport is en route.   12:49 AM  [facility] transport team at bedside. Patient with no signs of clinical deterioration here. Stable for transfer. EMTALA completed. Dr. [facility] accepting to the [facility] ED.    [facility], MD Feb 10, 2020 [phone]

## 2020-02-07 DIAGNOSIS — K2289 Other specified disease of esophagus: Secondary | ICD-10-CM | POA: Diagnosis not present

## 2020-02-07 DIAGNOSIS — D696 Thrombocytopenia, unspecified: Secondary | ICD-10-CM | POA: Diagnosis not present

## 2020-02-07 DIAGNOSIS — Z9049 Acquired absence of other specified parts of digestive tract: Secondary | ICD-10-CM | POA: Diagnosis not present

## 2020-02-07 DIAGNOSIS — Z01818 Encounter for other preprocedural examination: Secondary | ICD-10-CM | POA: Diagnosis not present

## 2020-02-07 DIAGNOSIS — Z980 Intestinal bypass and anastomosis status: Secondary | ICD-10-CM | POA: Diagnosis not present

## 2020-02-07 DIAGNOSIS — R042 Hemoptysis: Secondary | ICD-10-CM | POA: Diagnosis not present

## 2020-02-07 DIAGNOSIS — Z9889 Other specified postprocedural states: Secondary | ICD-10-CM | POA: Diagnosis not present

## 2020-02-07 DIAGNOSIS — E1165 Type 2 diabetes mellitus with hyperglycemia: Secondary | ICD-10-CM | POA: Diagnosis not present

## 2020-02-07 DIAGNOSIS — Z8501 Personal history of malignant neoplasm of esophagus: Secondary | ICD-10-CM | POA: Diagnosis not present

## 2020-02-07 DIAGNOSIS — J398 Other specified diseases of upper respiratory tract: Secondary | ICD-10-CM | POA: Diagnosis not present

## 2020-02-07 DIAGNOSIS — K91840 Postprocedural hemorrhage and hematoma of a digestive system organ or structure following a digestive system procedure: Secondary | ICD-10-CM | POA: Diagnosis not present

## 2020-02-07 DIAGNOSIS — E872 Acidosis: Secondary | ICD-10-CM | POA: Diagnosis not present

## 2020-02-07 DIAGNOSIS — Y834 Other reconstructive surgery as the cause of abnormal reaction of the patient, or of later complication, without mention of misadventure at the time of the procedure: Secondary | ICD-10-CM | POA: Diagnosis not present

## 2020-02-07 DIAGNOSIS — J982 Interstitial emphysema: Secondary | ICD-10-CM | POA: Diagnosis not present

## 2020-02-07 DIAGNOSIS — Z934 Other artificial openings of gastrointestinal tract status: Secondary | ICD-10-CM | POA: Diagnosis not present

## 2020-02-07 DIAGNOSIS — Z885 Allergy status to narcotic agent status: Secondary | ICD-10-CM | POA: Diagnosis not present

## 2020-02-07 DIAGNOSIS — Z9911 Dependence on respirator [ventilator] status: Secondary | ICD-10-CM | POA: Diagnosis not present

## 2020-02-07 DIAGNOSIS — I469 Cardiac arrest, cause unspecified: Secondary | ICD-10-CM | POA: Diagnosis not present

## 2020-02-07 DIAGNOSIS — K922 Gastrointestinal hemorrhage, unspecified: Secondary | ICD-10-CM | POA: Diagnosis not present

## 2020-02-07 DIAGNOSIS — Z4682 Encounter for fitting and adjustment of non-vascular catheter: Secondary | ICD-10-CM | POA: Diagnosis not present

## 2020-02-07 DIAGNOSIS — Z9281 Personal history of extracorporeal membrane oxygenation (ECMO): Secondary | ICD-10-CM | POA: Diagnosis not present

## 2020-02-07 DIAGNOSIS — J939 Pneumothorax, unspecified: Secondary | ICD-10-CM | POA: Diagnosis not present

## 2020-02-07 DIAGNOSIS — T8132XA Disruption of internal operation (surgical) wound, not elsewhere classified, initial encounter: Secondary | ICD-10-CM | POA: Diagnosis not present

## 2020-02-07 DIAGNOSIS — R001 Bradycardia, unspecified: Secondary | ICD-10-CM | POA: Diagnosis not present

## 2020-02-07 DIAGNOSIS — Z888 Allergy status to other drugs, medicaments and biological substances status: Secondary | ICD-10-CM | POA: Diagnosis not present

## 2020-02-07 DIAGNOSIS — D62 Acute posthemorrhagic anemia: Secondary | ICD-10-CM | POA: Diagnosis not present

## 2020-02-07 DIAGNOSIS — R000 Tachycardia, unspecified: Secondary | ICD-10-CM | POA: Diagnosis not present

## 2020-02-07 DIAGNOSIS — R58 Hemorrhage, not elsewhere classified: Secondary | ICD-10-CM | POA: Diagnosis not present

## 2020-02-07 DIAGNOSIS — Z978 Presence of other specified devices: Secondary | ICD-10-CM | POA: Diagnosis not present

## 2020-02-07 DIAGNOSIS — Z9221 Personal history of antineoplastic chemotherapy: Secondary | ICD-10-CM | POA: Diagnosis not present

## 2020-02-07 DIAGNOSIS — J9601 Acute respiratory failure with hypoxia: Secondary | ICD-10-CM | POA: Diagnosis not present

## 2020-02-07 DIAGNOSIS — Z923 Personal history of irradiation: Secondary | ICD-10-CM | POA: Diagnosis not present

## 2020-02-07 DIAGNOSIS — R1310 Dysphagia, unspecified: Secondary | ICD-10-CM | POA: Diagnosis not present

## 2020-02-07 DIAGNOSIS — Z79899 Other long term (current) drug therapy: Secondary | ICD-10-CM | POA: Diagnosis not present

## 2020-02-07 DIAGNOSIS — Z789 Other specified health status: Secondary | ICD-10-CM | POA: Diagnosis not present

## 2020-02-07 DIAGNOSIS — C159 Malignant neoplasm of esophagus, unspecified: Secondary | ICD-10-CM | POA: Diagnosis not present

## 2020-02-07 DIAGNOSIS — K9189 Other postprocedural complications and disorders of digestive system: Secondary | ICD-10-CM | POA: Diagnosis not present

## 2020-02-07 DIAGNOSIS — R578 Other shock: Secondary | ICD-10-CM | POA: Diagnosis not present

## 2020-02-07 DIAGNOSIS — E875 Hyperkalemia: Secondary | ICD-10-CM | POA: Diagnosis not present

## 2020-02-07 DIAGNOSIS — K660 Peritoneal adhesions (postprocedural) (postinfection): Secondary | ICD-10-CM | POA: Diagnosis not present

## 2020-02-07 DIAGNOSIS — Z9989 Dependence on other enabling machines and devices: Secondary | ICD-10-CM | POA: Diagnosis not present

## 2020-02-07 DIAGNOSIS — Z8674 Personal history of sudden cardiac arrest: Secondary | ICD-10-CM | POA: Diagnosis not present

## 2020-02-07 DIAGNOSIS — J3800 Paralysis of vocal cords and larynx, unspecified: Secondary | ICD-10-CM | POA: Diagnosis not present

## 2020-02-07 DIAGNOSIS — Z452 Encounter for adjustment and management of vascular access device: Secondary | ICD-10-CM | POA: Diagnosis not present

## 2020-02-07 DIAGNOSIS — E871 Hypo-osmolality and hyponatremia: Secondary | ICD-10-CM | POA: Diagnosis not present

## 2020-02-07 DIAGNOSIS — R918 Other nonspecific abnormal finding of lung field: Secondary | ICD-10-CM | POA: Diagnosis not present

## 2020-02-07 DIAGNOSIS — K920 Hematemesis: Secondary | ICD-10-CM | POA: Diagnosis not present

## 2020-02-07 LAB — TYPE AND SCREEN
ABO/RH(D): O POS
Antibody Screen: NEGATIVE
Unit division: 0
Unit division: 0

## 2020-02-07 LAB — BPAM RBC
Blood Product Expiration Date: 202202032359
Blood Product Expiration Date: 202202032359
ISSUE DATE / TIME: 202112292216
ISSUE DATE / TIME: 202112292216
Unit Type and Rh: 5100
Unit Type and Rh: 5100

## 2020-02-07 LAB — BLOOD PRODUCT ORDER (VERBAL) VERIFICATION

## 2020-02-09 NOTE — ED Notes (Addendum)
Report given to Granville Lewis, RN at [facility] Emergency Department. [facility] transfer service ETA 30 minutes.

## 2020-02-09 NOTE — ED Notes (Addendum)
Pre blood vitals:   HR: 119  RR: 23 Pulse: 119  BP: 113/75 Temp: 98.2   Verified with Bayard Beaver, RN

## 2020-02-09 DEATH — deceased

## 2020-02-12 ENCOUNTER — Encounter: Payer: Self-pay | Admitting: *Deleted

## 2020-02-12 NOTE — Progress Notes (Signed)
At the request of daughter, sent referral for bereavement counseling for wife due to sudden death of husband with cancer. Faxed to [employer] [phone]

## 2020-02-22 ENCOUNTER — Ambulatory Visit: Payer: PPO | Admitting: Oncology

## 2020-02-22 ENCOUNTER — Other Ambulatory Visit: Payer: PPO

## 2020-02-22 ENCOUNTER — Ambulatory Visit: Payer: PPO

## 2021-01-20 IMAGING — CT CT CHEST W/ CM
2 of 3 series · 14 of 36 positions shown, 17 images · IV contrast (omnipaque)
Comparison: 01/25/2020

CLINICAL DATA: Esophageal cancer status post esophagectomy and
esophageal stenting. Hematemesis.

EXAM:
CT CHEST WITH CONTRAST
TECHNIQUE: Multidetector CT imaging of the chest was performed during
intravenous contrast administration.
CONTRAST:  75mL OMNIPAQUE IOHEXOL 300 MG/ML  SOLN

[Series 3: chest with 2mm st · axial · 0.73mm/px · z∈[+1264,+1540]mm · 11 of 163 slices shown, 14 images]
[im 13/163  mediastinal]
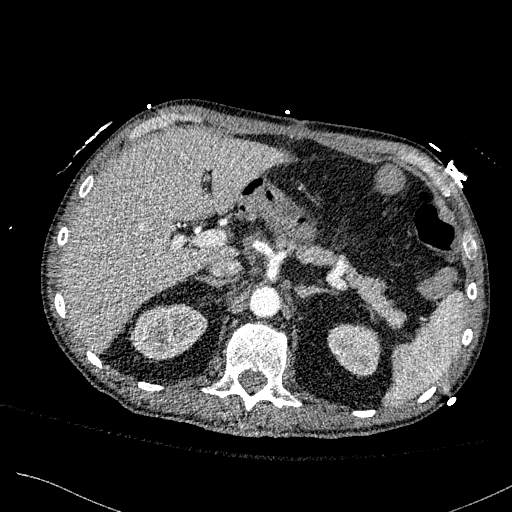
[im 13/163  lung]
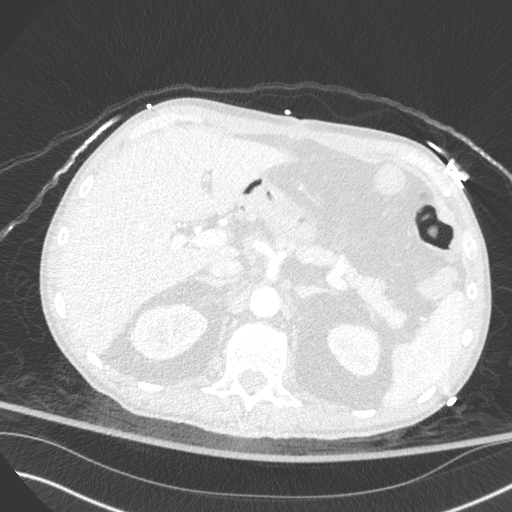
[im 25/163  lung]
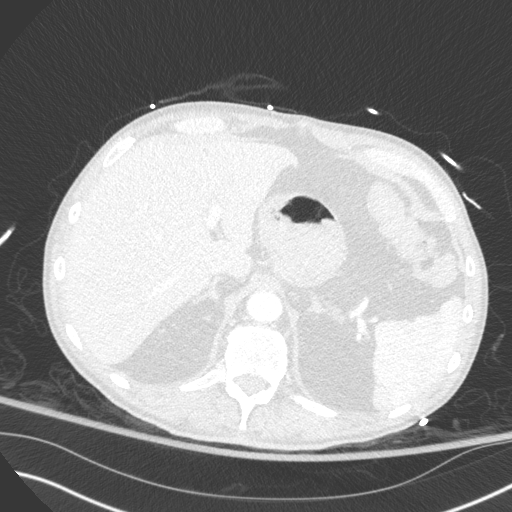
[im 37/163  lung]
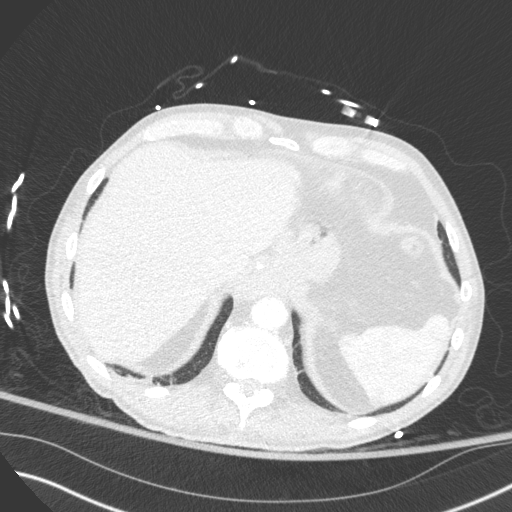
[im 55/163  lung]
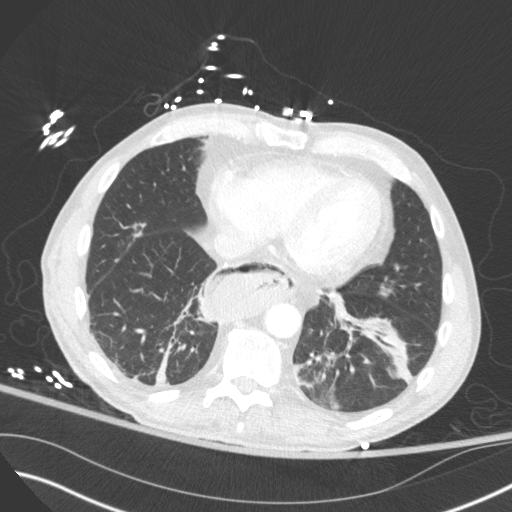
[im 67/163  mediastinal]
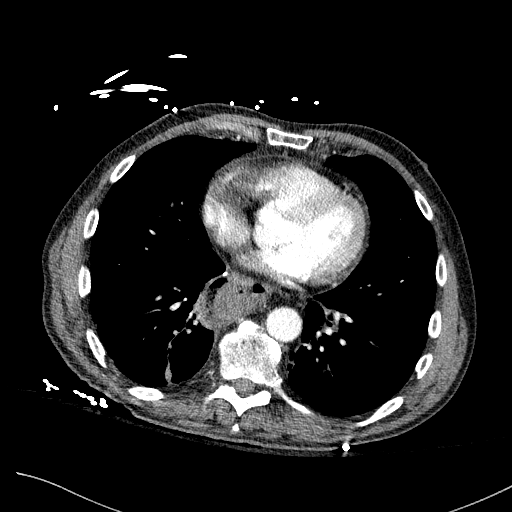
[im 67/163  lung]
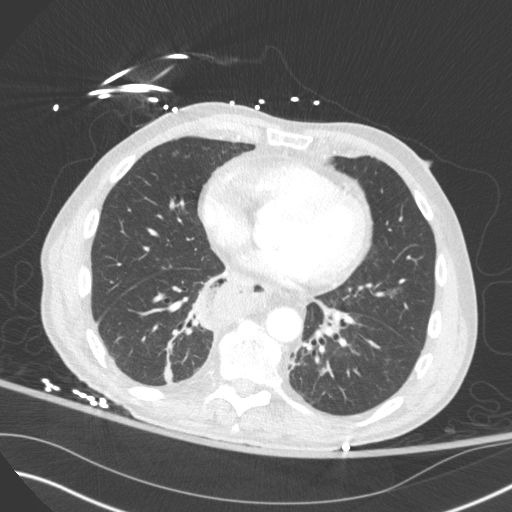
[im 85/163  lung]
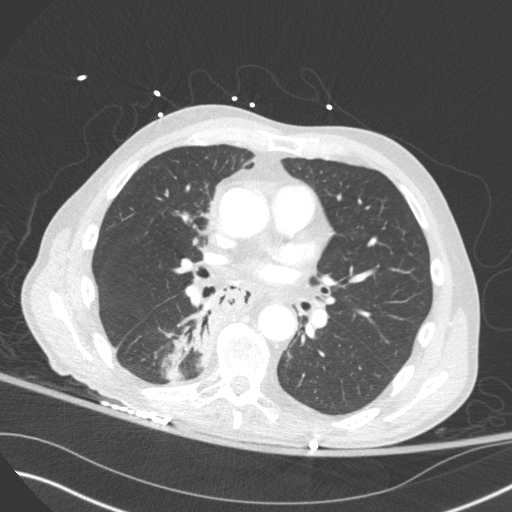
[im 97/163  lung]
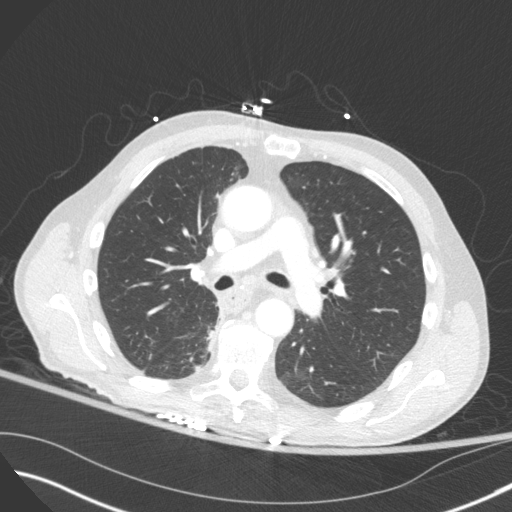
[im 109/163  lung]
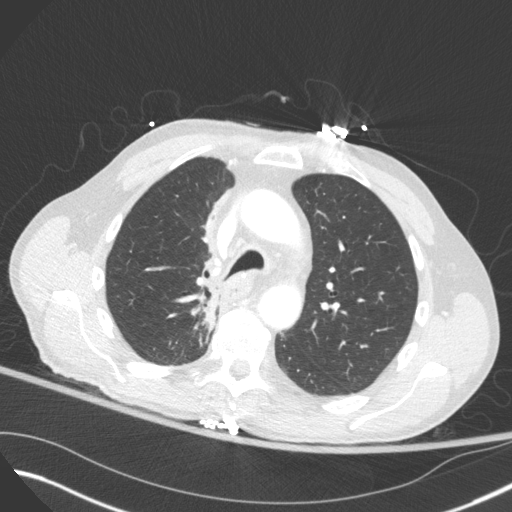
[im 127/163  mediastinal]
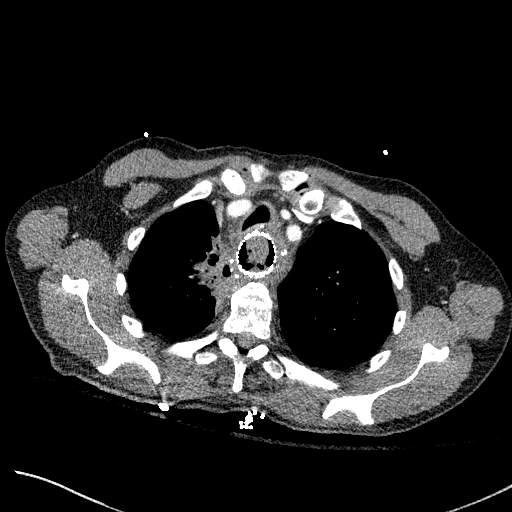
[im 127/163  lung]
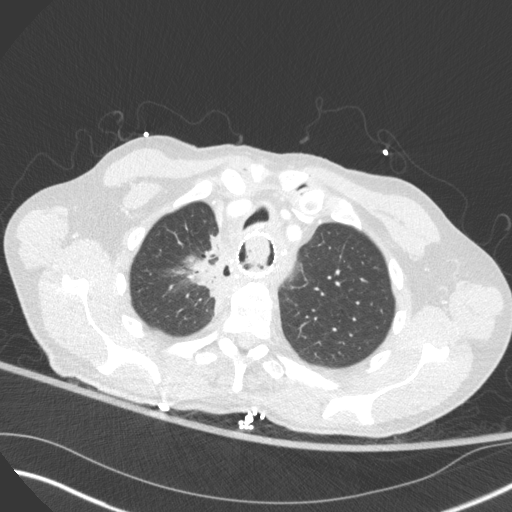
[im 139/163  lung]
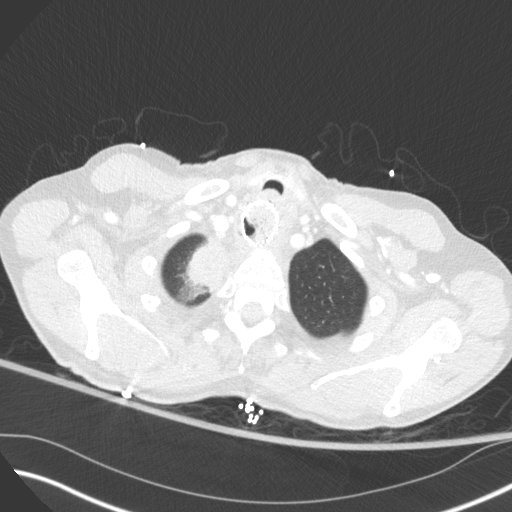
[im 151/163  lung]
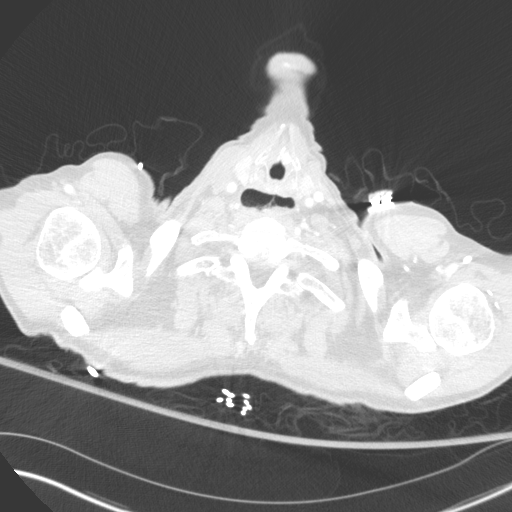

[Series 5: chest with 2mm st cor · coronal · 0.64mm/px · 3 of 136 slices shown]
[im 28/136  lung]
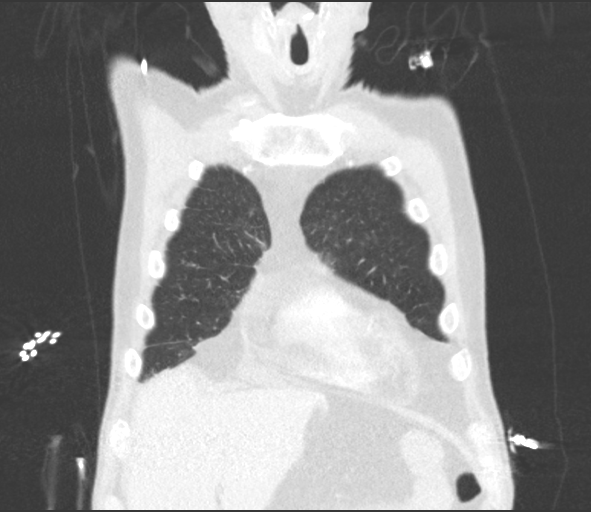
[im 55/136  lung]
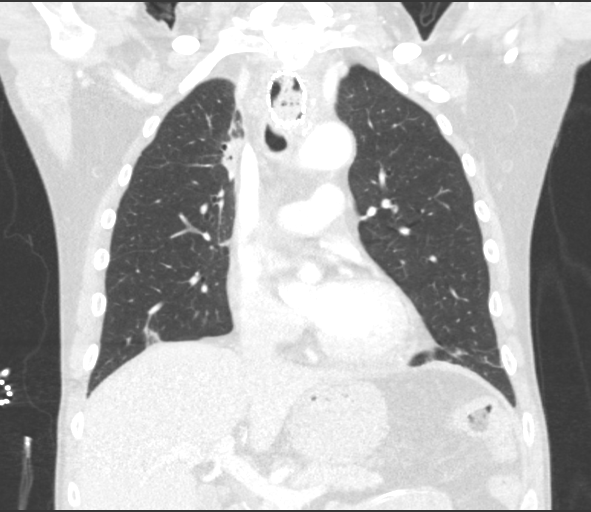
[im 82/136  lung]
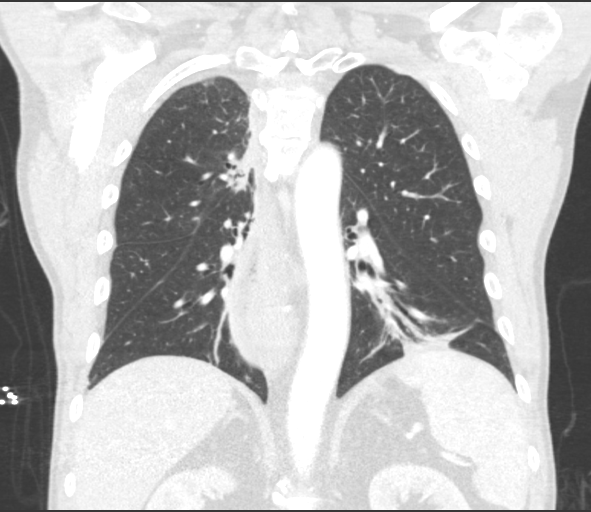

[14 of 36 positions shown; findings below may reference images not displayed]

FINDINGS: Cardiovascular: Moderate coronary artery calcification primarily
within the left anterior descending coronary artery. Proximal left
anterior descending coronary artery stenting has been performed.
Global cardiac size within normal limits. Trace pericardial effusion
is unchanged. Central pulmonary arteries are of normal caliber. The
thoracic aorta is unremarkable.

Mediastinum/Nodes: Surgical changes of esophagectomy and gastric
pull-up are again identified. And esophageal stent extends from the
a hypopharynx just through the esophagogastric anastomosis. This
appears unchanged in configuration and position since prior
examination. There is, however, extensive intraluminal debris within
the stent now identified distally, there is moderate intraluminal
debris within the intrathoracic stomach, new since prior
examination, possibly representing blood product given the history
of hematemesis. Tiny focus of extraluminal gas within the right
mediastinum adjacent to the a esophagogastric anastomosis is again
identified and appears stable since prior examination. No additional
pneumomediastinum is identified. No pathologic thoracic adenopathy.

Lungs/Pleura: Consolidative changes are again identified within the
paramediastinal right apex and there is paramediastinal cicatricial
change within the right suprahilar region possibly reflecting
changes of radiation therapy. Consolidative changes within the right
apex, however, have progressed in the interval since prior
examination. Mild bibasilar atelectasis. No additional superimposed
focal pulmonary infiltrate. No focal pulmonary nodules. No
pneumothorax or pleural effusion. Central airways are widely patent.

Upper Abdomen: Feeding tube is partially visualized within the left
upper quadrant small bowel. No acute abnormality identified within
the visualized upper abdomen.

Musculoskeletal: Osseous structures are age-appropriate. No lytic or
blastic bone lesions. No acute bone abnormality.
IMPRESSION: Stable appearance of esophagectomy and gastric pull-up and
endoluminal stenting of the proximal esophagus across the
anastomosis. Unchanged tiny focus of extraluminal gas adjacent to
the anastomosis, likely residual or potentially within the proximal
gastric lumen. Interval development of extensive debris within the
stent and within the intrathoracic gastric lumen possibly
representing ingested debris or blood product given the history of
hematemesis. No active extravasation identified.

Progressive consolidative changes at the right apex presumably
related to radiation therapy.

Coronary artery disease.

## 2021-01-20 IMAGING — DX DG CHEST 1V PORT
1 series · 1 of 1 positions shown · non-contrast
Comparison: Chest CT 01/25/2020

CLINICAL DATA: hx of esophageal removal, anastamosis of stomach and
oropharynx, here with hematemesis, evaluate for evidence of
perforation/free air

EXAM:
PORTABLE CHEST 1 VIEW

[chest]
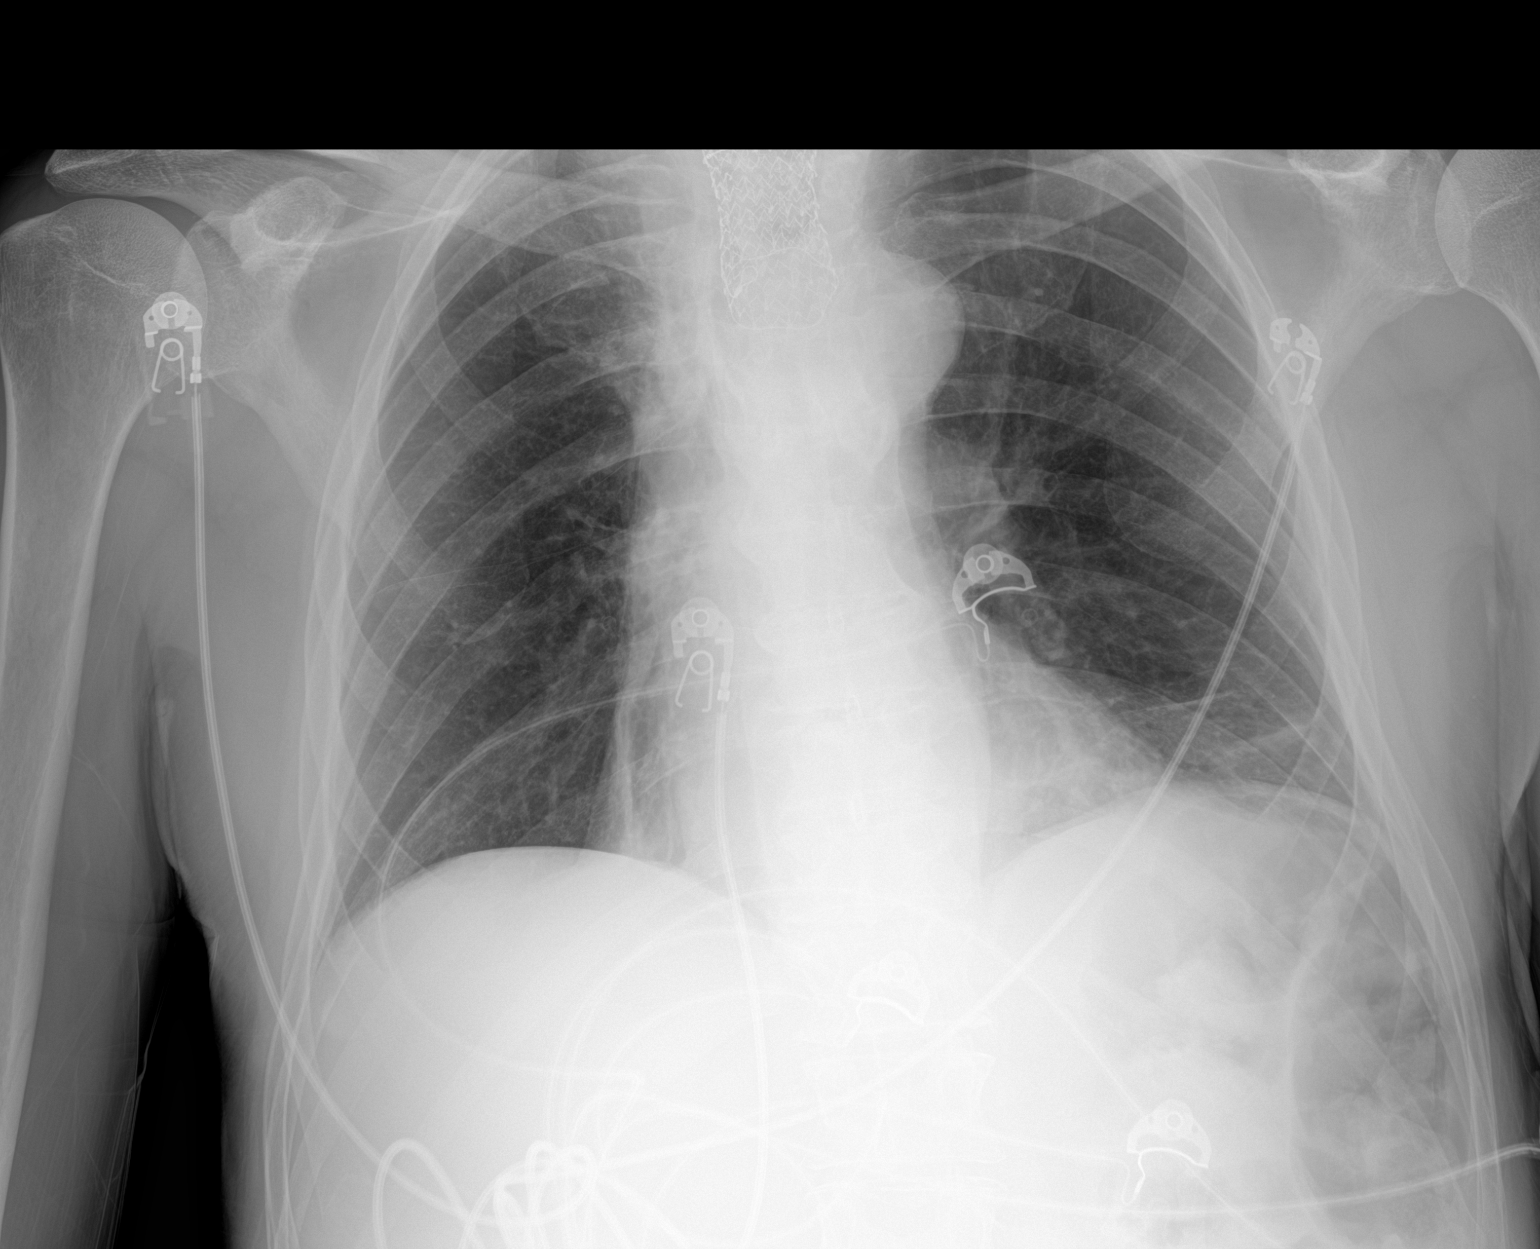

[1 of 1 positions shown; findings below may reference images not displayed]

FINDINGS: Upper esophageal stent is unchanged in position. There is stable
adjacent soft tissue density in the right paratracheal region.
Gastric pull-through. No pneumothorax or visualized
pneumomediastinum. Left basilar atelectasis or scarring, similar to
prior. No significant pleural effusion. Stable heart size and
mediastinal contours. No evidence of free air in the upper abdomen.
IMPRESSION: 1. No new abnormality.
2. Upper esophageal stent is unchanged in position with stable right
paratracheal soft tissue density. No evidence of pneumomediastinum,
pneumothorax, or free air.
3. Stable left basilar atelectasis or scarring.
